# Patient Record
Sex: Female | Born: 1950 | Race: White | Hispanic: No | Marital: Married | Smoking: Current every day smoker
Health system: Southern US, Community
[De-identification: ages and names within clinical notes are randomized; demographics above are authoritative.]

## PROBLEM LIST (undated history)

## (undated) DIAGNOSIS — G4730 Sleep apnea, unspecified: Secondary | ICD-10-CM

## (undated) DIAGNOSIS — J189 Pneumonia, unspecified organism: Secondary | ICD-10-CM

## (undated) DIAGNOSIS — I10 Essential (primary) hypertension: Secondary | ICD-10-CM

## (undated) DIAGNOSIS — I509 Heart failure, unspecified: Secondary | ICD-10-CM

## (undated) DIAGNOSIS — J449 Chronic obstructive pulmonary disease, unspecified: Secondary | ICD-10-CM

## (undated) DIAGNOSIS — E7800 Pure hypercholesterolemia, unspecified: Secondary | ICD-10-CM

## (undated) HISTORY — PX: APPENDECTOMY: SHX54

## (undated) HISTORY — PX: BACK SURGERY: SHX140

## (undated) HISTORY — PX: TUBAL LIGATION: SHX77

## (undated) HISTORY — DX: Pure hypercholesterolemia, unspecified: E78.00

---

## 2000-05-01 ENCOUNTER — Encounter: Payer: Self-pay | Admitting: Emergency Medicine

## 2000-05-01 ENCOUNTER — Emergency Department (HOSPITAL_COMMUNITY): Admission: EM | Admit: 2000-05-01 | Discharge: 2000-05-01 | Payer: Self-pay | Admitting: Emergency Medicine

## 2002-12-12 ENCOUNTER — Encounter: Admission: RE | Admit: 2002-12-12 | Discharge: 2002-12-12 | Payer: Self-pay | Admitting: Occupational Medicine

## 2003-04-08 ENCOUNTER — Ambulatory Visit (HOSPITAL_COMMUNITY): Admission: RE | Admit: 2003-04-08 | Discharge: 2003-04-09 | Payer: Self-pay | Admitting: Neurosurgery

## 2006-04-27 ENCOUNTER — Inpatient Hospital Stay (HOSPITAL_COMMUNITY): Admission: EM | Admit: 2006-04-27 | Discharge: 2006-04-29 | Payer: Self-pay | Admitting: Emergency Medicine

## 2006-08-30 ENCOUNTER — Encounter: Admission: RE | Admit: 2006-08-30 | Discharge: 2006-08-30 | Payer: Self-pay | Admitting: Family Medicine

## 2006-10-31 ENCOUNTER — Encounter: Admission: RE | Admit: 2006-10-31 | Discharge: 2006-10-31 | Payer: Self-pay | Admitting: Family Medicine

## 2008-09-12 ENCOUNTER — Encounter: Admission: RE | Admit: 2008-09-12 | Discharge: 2008-09-12 | Payer: Self-pay | Admitting: Occupational Medicine

## 2010-07-02 NOTE — Discharge Summary (Signed)
NAMELESIA, MONICA           ACCOUNT NO.:  [id number]   MEDICAL RECORD NO.:  [id number]          PATIENT TYPE:  INP   LOCATION:  1325                         FACILITY:  [facility]   PHYSICIAN:  Madaline Savage, MD        DATE OF BIRTH:  1950/07/09   DATE OF ADMISSION:  04/26/2006  DATE OF DISCHARGE:  04/29/2006                               DISCHARGE SUMMARY   PRIMARY CARE PHYSICIAN:  She does not follow up with a primary care  doctor.  The patient is unassigned to [place].   DISCHARGE DIAGNOSIS:  1. Acute bronchitis.  2. Hypoxemia secondary to acute bronchitis.  3. Tobacco abuse.  4. Hypokalemia.  5. Obesity.  6. Questionable obstructive sleep apnea.   DISCHARGE MEDICATIONS:  1. Avelox 400 mg once daily for five more days.  2. Combivent 1 puff 76 hours as needed.  3. Mucinex 600 mg twice daily for 6 more days.   HISTORY OF PRESENT ILLNESS:  For full history and physical, see the  history and physical dictated by Dr. Waymon Amato on April 26, 2006.  In  short, Ms. Jawad is a 60 year old Caucasian lady with no  significant past medical history who comes in with a productive cough,  dyspnea and oxygen desaturation.  She desaturated to 78-79% on room air  in the ER, and she was admitted for further evaluation and management.   PROBLEM LIST:  1. Acute bronchitis.  She did have an x-ray chest on admission which      was negative.  She was started on IV Avelox and put on aerosols,      and her breathing has improved.  Her cough has also improved.  Will      plan to treat her with Avelox for a total of 7 days.  Will continue      her on Combivent at home and also put her on Mucinex.  2. Hypoxemia.  She did have hypoxemia on admission, which is secondary      to her bronchitis.  We did a D-dimer to rule out a PE which came      back negative.  3. Questionable obstructive sleep apnea.  She did desaturate to 81% on      room air while in the hospital.  This did come up, and her oxygen    was put on 2 liters.  She gives me history significant for      obstructive sleep apnea, including daytime somnolence and non-      restful sleep.  She will likely need an outpatient sleep study at      the time of discharge.   DISPOSITION:  She will now be discharged home in stable condition.  During the course of hospital stay, on initial questioning she said she  did want to be a do not resuscitate status, but on further questioning  after admission she said she does want to be a full code.  So, please  note that the patient is a full code at this time.   FOLLOW UP:  She does not have a primary care doctor, and  she is advised  to follow-up with Dr. Amada Kingfisher Bonsu with [facility] in  about 1-2 weeks.  She has been given Dr. Vonda Antigua phone number [phone].  She will call and make an appointment to follow-up with them.      Madaline Savage, MD  Electronically Signed     PKN/MEDQ  D:  04/29/2006  T:  04/30/2006  Job:  [id number]   cc:   Dr. Amada Kingfisher Bonsu

## 2010-07-02 NOTE — Op Note (Signed)
NAME:  Kaylee Pittman, Kaylee Pittman                     ACCOUNT NO.:  [id number]   MEDICAL RECORD NO.:  [id number]                   PATIENT TYPE:  OIB   LOCATION:  3021                                 FACILITY:  [facility]   PHYSICIAN:  Reinaldo Meeker, M.D.              DATE OF BIRTH:  December 01, 1950   DATE OF PROCEDURE:  DATE OF DISCHARGE:  04/08/2003                                 OPERATIVE REPORT   PREOPERATIVE DIAGNOSIS:  Herniated disk at L4-5 left, bilateral.   POSTOPERATIVE DIAGNOSIS:  Herniated disk at L4-5 left, bilateral.   PROCEDURE:  Left L4-5 extraforaminal microdiskectomy.   SURGEON:  Reinaldo Meeker, M.D.   ASSISTANT:  Kaylee Pittman, M.D.   DESCRIPTION OF PROCEDURE:  After being placed in the prone position, the  patient's back was prepped and draped in the usual sterile fashion.  A  localizing x-ray was taken prior to the incision and we identified the  appropriate level.  A midline incision was made above the spinous process of  L4 and L5.  Using Bovie cutting cautery, the incision was carried out above  the spinous processes.  Subperiosteal dissection was then carried out.  Subperiosteal dissection was then carried out on the left side, the longus  spinous processes and lamina and facet joints, all the way down to the  extraforaminal region.  The transverse process of L4 was identified and  cleared of any soft tissue.  Self-retaining retractor was placed for  exposure and x-rays showed that we were at the appropriate level.  A high  speed drill was used to remove in the inferior edge of the transverse  process, the lateral edge of the facet joint, particularly where the two  came together.  This area was undermined.  The anterior transverse ligament  was then incised and the cut edges removed.  Fat and soft tissue in the area  were then identified and removed and the L4 nerve root and L4-5 disk quickly  came into view with the L4-5 disk compressing significantly upon the  L4-5  nerve root.  The microscope was draped and brought into the field and used  to the remainder of the case.  After coagulating on the covering of the L4-5  disk, that covering was incised, and the large herniated fragment was  removed in a piecemeal fashion with pituitary rongeurs.  As more of the disk  material was removed, the nerve root was no longer compressed.  When  thorough clean out had been carried out of the herniated fragment,  inspection was carried out in all directions without any evidence of  residual compression and none could be identified.  Large amounts of  irrigation were carried out and any bleeding controlled with bipolar  coagulation and Gelfoam.  The wound was then closed using interrupted Vicryl  in the muscle, fascia and subcutaneous and subcuticular tissues and staples  on the skin.  A sterile dressing was  then applied.  The patient was  extubated and taken to the recovery room in stable condition.                                               Reinaldo Meeker, M.D.    ROK/MEDQ  D:  04/08/2003  T:  04/08/2003  Job:  [id number]
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
## 2011-05-02 ENCOUNTER — Emergency Department (HOSPITAL_COMMUNITY): Payer: Medicaid Other

## 2011-05-02 ENCOUNTER — Encounter (HOSPITAL_COMMUNITY): Payer: Self-pay | Admitting: *Deleted

## 2011-05-02 ENCOUNTER — Emergency Department (HOSPITAL_COMMUNITY)
Admission: EM | Admit: 2011-05-02 | Discharge: 2011-05-02 | Disposition: A | Discharge status: Home / Self Care | Payer: Medicaid Other | Attending: Emergency Medicine | Admitting: Emergency Medicine

## 2011-05-02 DIAGNOSIS — R062 Wheezing: Secondary | ICD-10-CM

## 2011-05-02 DIAGNOSIS — R059 Cough, unspecified: Secondary | ICD-10-CM | POA: Insufficient documentation

## 2011-05-02 DIAGNOSIS — F17200 Nicotine dependence, unspecified, uncomplicated: Secondary | ICD-10-CM | POA: Insufficient documentation

## 2011-05-02 DIAGNOSIS — G4730 Sleep apnea, unspecified: Secondary | ICD-10-CM | POA: Insufficient documentation

## 2011-05-02 DIAGNOSIS — R51 Headache: Secondary | ICD-10-CM | POA: Insufficient documentation

## 2011-05-02 DIAGNOSIS — J4489 Other specified chronic obstructive pulmonary disease: Secondary | ICD-10-CM | POA: Insufficient documentation

## 2011-05-02 DIAGNOSIS — J449 Chronic obstructive pulmonary disease, unspecified: Secondary | ICD-10-CM | POA: Insufficient documentation

## 2011-05-02 DIAGNOSIS — R05 Cough: Secondary | ICD-10-CM | POA: Insufficient documentation

## 2011-05-02 HISTORY — DX: Chronic obstructive pulmonary disease, unspecified: J44.9

## 2011-05-02 HISTORY — DX: Sleep apnea, unspecified: G47.30

## 2011-05-02 MED ORDER — PREDNISONE 20 MG PO TABS
60.0000 mg | ORAL_TABLET | Freq: Once | ORAL | Status: AC
  Administered 2011-05-02: 60 mg via ORAL
  Filled 2011-05-02: qty 3

## 2011-05-02 MED ORDER — ALBUTEROL SULFATE HFA 108 (90 BASE) MCG/ACT IN AERS
6.0000 | INHALATION_SPRAY | RESPIRATORY_TRACT | Status: AC
  Administered 2011-05-02 (×2): 6 via RESPIRATORY_TRACT
  Filled 2011-05-02: qty 6.7

## 2011-05-02 MED ORDER — PREDNISONE 50 MG PO TABS: 50.0000 mg | ORAL_TABLET | Freq: Every day | ORAL | Status: AC

## 2011-05-02 MED ORDER — KETOROLAC TROMETHAMINE 60 MG/2ML IM SOLN
60.0000 mg | Freq: Once | INTRAMUSCULAR | Status: AC
  Administered 2011-05-02: 60 mg via INTRAMUSCULAR
  Filled 2011-05-02: qty 2

## 2011-05-02 MED ORDER — ALBUTEROL SULFATE HFA 108 (90 BASE) MCG/ACT IN AERS: 1.0000 | INHALATION_SPRAY | Freq: Four times a day (QID) | RESPIRATORY_TRACT | Status: DC | PRN

## 2011-05-02 NOTE — Discharge Instructions (Signed)
Chronic Obstructive Pulmonary Disease Chronic obstructive pulmonary disease (COPD) is a lung disease. The lungs become damaged, making it hard to get air in and out of your lungs. The damage to your lungs cannot be changed.  HOME CARE  Stop smoking if you smoke. Avoid secondhand smoke.   Only take medicine as told by your doctor.   Talk to your doctor about using cough syrup or over-the-counter medicines.   Drink enough fluids to keep your pee (urine) clear or pale yellow.   Use a humidifier or vaporizer. This may help loosen the thick spit (mucus).   Talk to your doctor about vaccines that help prevent other lung problems (pneumonia and flu vaccines).   Use home oxygen as told by your doctor.   Stay active and exercise.   Eat healthy foods.  GET HELP RIGHT AWAY IF:   Your heart is beating fast.   You become disturbed, confused, shake, or are dazed.   You have trouble breathing.   You have chest pain.   You have a fever.   You cough up thick spit that is yellowish-white or green.   Your breathing becomes worse when you exercise.   You are running out of the medicine you take for your breathing.  MAKE SURE YOU:   Understand these instructions.   Will watch your condition.   Will get help right away if you are not doing well or get worse.  Document Released: 07/20/2007 Document Revised: 01/20/2011 Document Reviewed: 04/02/2010 [facility] Patient Information 2012 [place], [place].  Return for any new or worsening symptoms or any other concerns.

## 2011-05-02 NOTE — ED Provider Notes (Signed)
History     CSN: [id number]  Arrival date & time 05/02/11  4098   First MD Initiated Contact with Patient 05/02/11 1957      Chief Complaint  Patient presents with  . Cough    (Consider location/radiation/quality/duration/timing/severity/associated sxs/prior treatment) HPI CC cough and sob chronic for 5 years, has progressively worsened over that time, no acute sob or increase in cough.  Sx's mild to moderate, worse with exertion.  Pt also c/o HA, mild, onset 3 weeks ago, similar to past HA's.   Past Medical History  Diagnosis Date  . Sleep apnea   . COPD (chronic obstructive pulmonary disease)     History reviewed. No pertinent past surgical history.  History reviewed. No pertinent family history.  History  Substance Use Topics  . Smoking status: Current Everyday Smoker -- 2.0 packs/day    Types: Cigarettes  . Smokeless tobacco: Not on file  . Alcohol Use: No    OB History    Grav Para Term Preterm Abortions TAB SAB Ect Mult Living                  Review of Systems  Constitutional: Negative for fever and chills.  Respiratory: Positive for cough, shortness of breath and wheezing.   Cardiovascular: Negative for chest pain.  Gastrointestinal: Negative for nausea and vomiting.  Neurological: Positive for headaches. Negative for speech difficulty, weakness and light-headedness.  Psychiatric/Behavioral: Negative for behavioral problems and confusion.  All other systems reviewed and are negative.    Allergies  Sulfa antibiotics  Home Medications   Current Outpatient Rx  Name Route Sig Dispense Refill  . ASPIRIN 325 MG PO TABS Oral Take 325 mg by mouth daily.    . BC HEADACHE POWDER PO Oral Take 1 packet by mouth every 8 (eight) hours as needed. For headache    . IBUPROFEN 200 MG PO TABS Oral Take 800 mg by mouth every 6 (six) hours as needed. For pain    . ALBUTEROL SULFATE HFA 108 (90 BASE) MCG/ACT IN AERS Inhalation Inhale 1-2 puffs into the lungs every 6  (six) hours as needed for wheezing. 1 Inhaler 0  . PREDNISONE 50 MG PO TABS Oral Take 1 tablet (50 mg total) by mouth daily. 4 tablet 0    BP 156/65  Pulse 77  Temp(Src) 98.9 F (37.2 C) (Oral)  Resp 16  SpO2 94%  Physical Exam  Nursing note and vitals reviewed. Constitutional: She appears well-developed and well-nourished.  HENT:  Head: Normocephalic and atraumatic.  Eyes: Right eye exhibits no discharge. Left eye exhibits no discharge.  Neck: Normal range of motion. Neck supple.  Cardiovascular: Normal rate, regular rhythm and normal heart sounds.   Pulmonary/Chest: Effort normal. No respiratory distress (speaks in full sentences, no accessory muscle use). She has wheezes (mild).  Abdominal: Soft. There is no tenderness.  Musculoskeletal: She exhibits no edema and no tenderness.  Neurological: She is alert. She has normal strength. No cranial nerve deficit or sensory deficit. She displays a negative Romberg sign. Coordination and gait normal. GCS eye subscore is 4. GCS verbal subscore is 5. GCS motor subscore is 6.  Skin: Skin is warm and dry.  Psychiatric: She has a normal mood and affect. Her behavior is normal.    ED Course  Procedures (including critical care time)  Labs Reviewed - No data to display Dg Chest 2 View  05/02/2011  *RADIOLOGY REPORT*  Clinical Data: Cough with shortness of breath.  History of CHF  and hypertension.  History of smoking.  CHEST - 2 VIEW  Comparison: 10/31/2006.  Findings: Cardiomegaly.  No infiltrates or failure.  No effusion or pneumothorax.  Bones unremarkable.  Mild scarring left base. Slight worsening aeration compared with priors.  IMPRESSION: Mild cardiomegaly without active infiltrates.  Slight scarring left base.  Original Report Authenticated By: Elsie Stain, M.D.     1. COPD (chronic obstructive pulmonary disease)   2. Wheezing       MDM  Pt is in nad, afvss, nontoxic appearing, exam and hx as above. Pt satting well, speaks in  full sentences, no accessory muscle use, mild wheezes on exam, cxr shows nothing acute, pt given albuterol mdi and   Prednisone, will recheck  Pt states she feels much better, HA gone, will d/c, return warnings given        Elijio Miles, MD 05/02/11 2324

## 2011-05-02 NOTE — ED Notes (Signed)
To ed for eval of coughing and sob for past 2 weeks. Pt smokes 1-2 packs a day

## 2011-05-03 NOTE — ED Provider Notes (Signed)
I saw and evaluated the patient, reviewed the resident's note and I agree with the findings and plan.  Dg Chest 2 View  05/02/2011  *RADIOLOGY REPORT*  Clinical Data: Cough with shortness of breath.  History of CHF and hypertension.  History of smoking.  CHEST - 2 VIEW  Comparison: 10/31/2006.  Findings: Cardiomegaly.  No infiltrates or failure.  No effusion or pneumothorax.  Bones unremarkable.  Mild scarring left base. Slight worsening aeration compared with priors.  IMPRESSION: Mild cardiomegaly without active infiltrates.  Slight scarring left base.  Original Report Authenticated By: Elsie Stain, M.D.   1. COPD (chronic obstructive pulmonary disease)   2. Wheezing      Lyanne Co, MD 05/03/11 [phone]

## 2011-05-27 ENCOUNTER — Other Ambulatory Visit: Payer: Self-pay | Admitting: Internal Medicine

## 2011-05-27 DIAGNOSIS — Z1231 Encounter for screening mammogram for malignant neoplasm of breast: Secondary | ICD-10-CM

## 2011-06-07 ENCOUNTER — Ambulatory Visit
Admission: RE | Admit: 2011-06-07 | Discharge: 2011-06-07 | Disposition: A | Discharge status: Home / Self Care | Payer: Medicaid Other | Source: Ambulatory Visit | Attending: Internal Medicine | Admitting: Internal Medicine

## 2011-06-07 DIAGNOSIS — Z1231 Encounter for screening mammogram for malignant neoplasm of breast: Secondary | ICD-10-CM

## 2011-06-09 ENCOUNTER — Other Ambulatory Visit: Payer: Self-pay | Admitting: Internal Medicine

## 2011-06-09 DIAGNOSIS — R928 Other abnormal and inconclusive findings on diagnostic imaging of breast: Secondary | ICD-10-CM

## 2011-06-13 ENCOUNTER — Ambulatory Visit
Admission: RE | Admit: 2011-06-13 | Discharge: 2011-06-13 | Disposition: A | Discharge status: Home / Self Care | Payer: Medicaid Other | Source: Ambulatory Visit | Attending: Internal Medicine | Admitting: Internal Medicine

## 2011-06-13 DIAGNOSIS — R928 Other abnormal and inconclusive findings on diagnostic imaging of breast: Secondary | ICD-10-CM

## 2011-06-21 ENCOUNTER — Ambulatory Visit (HOSPITAL_BASED_OUTPATIENT_CLINIC_OR_DEPARTMENT_OTHER): Payer: Medicaid Other | Attending: Internal Medicine

## 2011-06-21 VITALS — Ht <= 58 in | Wt 207.0 lb

## 2011-06-21 DIAGNOSIS — G4733 Obstructive sleep apnea (adult) (pediatric): Secondary | ICD-10-CM | POA: Insufficient documentation

## 2011-06-23 ENCOUNTER — Encounter (HOSPITAL_COMMUNITY): Payer: Self-pay | Admitting: Emergency Medicine

## 2011-06-23 ENCOUNTER — Emergency Department (HOSPITAL_COMMUNITY): Payer: Medicaid Other

## 2011-06-23 ENCOUNTER — Emergency Department (HOSPITAL_COMMUNITY)
Admission: EM | Admit: 2011-06-23 | Discharge: 2011-06-23 | Disposition: A | Discharge status: Home / Self Care | Payer: Medicaid Other | Attending: Emergency Medicine | Admitting: Emergency Medicine

## 2011-06-23 DIAGNOSIS — R609 Edema, unspecified: Secondary | ICD-10-CM | POA: Insufficient documentation

## 2011-06-23 DIAGNOSIS — Z79899 Other long term (current) drug therapy: Secondary | ICD-10-CM | POA: Insufficient documentation

## 2011-06-23 DIAGNOSIS — I509 Heart failure, unspecified: Secondary | ICD-10-CM | POA: Insufficient documentation

## 2011-06-23 DIAGNOSIS — I10 Essential (primary) hypertension: Secondary | ICD-10-CM | POA: Insufficient documentation

## 2011-06-23 DIAGNOSIS — M7989 Other specified soft tissue disorders: Secondary | ICD-10-CM | POA: Insufficient documentation

## 2011-06-23 DIAGNOSIS — R0989 Other specified symptoms and signs involving the circulatory and respiratory systems: Secondary | ICD-10-CM | POA: Insufficient documentation

## 2011-06-23 DIAGNOSIS — R0609 Other forms of dyspnea: Secondary | ICD-10-CM | POA: Insufficient documentation

## 2011-06-23 HISTORY — DX: Essential (primary) hypertension: I10

## 2011-06-23 HISTORY — DX: Pneumonia, unspecified organism: J18.9

## 2011-06-23 HISTORY — DX: Heart failure, unspecified: I50.9

## 2011-06-23 LAB — BASIC METABOLIC PANEL
CO2: 29 mEq/L (ref 19–32)
Calcium: 9.3 mg/dL (ref 8.4–10.5)
Creatinine, Ser: 0.71 mg/dL (ref 0.50–1.10)
Glucose, Bld: 89 mg/dL (ref 70–99)

## 2011-06-23 LAB — DIFFERENTIAL
Eosinophils Relative: 2 % (ref 0–5)
Lymphocytes Relative: 22 % (ref 12–46)
Lymphs Abs: 2.1 10*3/uL (ref 0.7–4.0)
Monocytes Absolute: 0.7 10*3/uL (ref 0.1–1.0)

## 2011-06-23 LAB — CBC
HCT: 43.5 % (ref 36.0–46.0)
MCV: 95.6 fL (ref 78.0–100.0)
RBC: 4.55 MIL/uL (ref 3.87–5.11)
RDW: 13 % (ref 11.5–15.5)
WBC: 9.5 10*3/uL (ref 4.0–10.5)

## 2011-06-23 MED ORDER — ENOXAPARIN SODIUM 100 MG/ML ~~LOC~~ SOLN
1.5000 mg/kg | Freq: Once | SUBCUTANEOUS | Status: AC
  Administered 2011-06-23: 140 mg via SUBCUTANEOUS
  Filled 2011-06-23: qty 2

## 2011-06-23 MED ORDER — ETHACRYNIC ACID 25 MG PO TABS: 25.0000 mg | ORAL_TABLET | Freq: Two times a day (BID) | ORAL | Status: DC

## 2011-06-23 NOTE — ED Provider Notes (Signed)
61 year old female had been on Lasix for control of peripheral edema but had to stop it because of allergy. Since then, she has had progressive swelling of her legs. She denies chest pain, heaviness, tightness, pressure. She did see her PCP today who referred her to the emergency department to rule out DVT. Clinically, she has 2-3+ pitting edema of her legs and no objective findings suggestive of DVT. She will need to be switched to a diuretic that does not cross-react with sulfa. Head d-dimer will be obtained to to try and rule out DVT. If positive, she will need to return in the morning for Doppler studies.  Dione Booze, MD 06/23/11 [phone]

## 2011-06-23 NOTE — ED Notes (Signed)
PT. REPORTS PROGRESSING BILATERAL LOWER LEG SWELLING FOR 3 WEEKS WITH SLIGHT SOB AND OCCASIONAL PRODUCTIVE COUGH.

## 2011-06-23 NOTE — ED Provider Notes (Signed)
History     CSN: [id number]  Arrival date & time 06/23/11  1955   First MD Initiated Contact with Patient 06/23/11 2056      Chief Complaint  Patient presents with  . Leg Swelling    (Consider location/radiation/quality/duration/timing/severity/associated sxs/prior treatment) HPI Comments: Progressively worsening bilateral lower extremity edema over 3 weeks.  Dyspnea c/w baseline (from her COPD) per the pt.  No fevers.  No recent immobility.  Pt says this has happened before with her CHF.  Has taken a diuretic in the past for her edema but is not on one now.  Patient is a 61 y.o. female presenting with leg pain. The history is provided by the patient.  Leg Pain  Incident onset: 3 weeks. The incident occurred at home. There was no injury mechanism. The pain is present in the left leg and right leg. The quality of the pain is described as aching. The pain is mild. Pertinent negatives include no numbness, no loss of motion and no loss of sensation. She reports no foreign bodies present. The symptoms are aggravated by nothing.    Past Medical History  Diagnosis Date  . Sleep apnea   . COPD (chronic obstructive pulmonary disease)   . CHF (congestive heart failure)   . Hypertension   . Pneumonia   . Emphysema     Past Surgical History  Procedure Date  . Back surgery   . Cesarean section     No family history on file.  History  Substance Use Topics  . Smoking status: Current Everyday Smoker -- 2.0 packs/day    Types: Cigarettes  . Smokeless tobacco: Not on file  . Alcohol Use: No    OB History    Grav Para Term Preterm Abortions TAB SAB Ect Mult Living                  Review of Systems  Constitutional: Negative for activity change.  HENT: Negative for congestion.   Eyes: Negative for visual disturbance.  Respiratory: Negative for chest tightness and shortness of breath.   Cardiovascular: Negative for chest pain and leg swelling.  Gastrointestinal: Negative for  abdominal pain.  Genitourinary: Negative for dysuria.  Skin: Negative for rash.  Neurological: Negative for syncope and numbness.  Psychiatric/Behavioral: Negative for behavioral problems.    Allergies  Sulfa antibiotics  Home Medications   Current Outpatient Rx  Name Route Sig Dispense Refill  . ACETAMINOPHEN 500 MG PO TABS Oral Take 500 mg by mouth every 6 (six) hours as needed.    . ALBUTEROL SULFATE HFA 108 (90 BASE) MCG/ACT IN AERS Inhalation Inhale 1-2 puffs into the lungs every 6 (six) hours as needed.    Marland Kitchen AMLODIPINE BESYLATE 5 MG PO TABS Oral Take 5 mg by mouth daily.    . ASPIRIN 325 MG PO TABS Oral Take 325 mg by mouth daily.    . BC HEADACHE POWDER PO Oral Take 1 packet by mouth every 8 (eight) hours as needed. For headache    . DOXYCYCLINE HYCLATE 100 MG PO CAPS Oral Take 100 mg by mouth 2 (two) times daily. For infection    . FUROSEMIDE 20 MG PO TABS Oral Take 20 mg by mouth 2 (two) times daily.    . IBUPROFEN 200 MG PO TABS Oral Take 800 mg by mouth every 6 (six) hours as needed. For pain    . MOMETASONE FURO-FORMOTEROL FUM 100-5 MCG/ACT IN AERO Inhalation Inhale 2 puffs into the lungs daily.    Marland Kitchen  OMEPRAZOLE 20 MG PO CPDR Oral Take 20 mg by mouth 2 (two) times daily.    Marland Kitchen ETHACRYNIC ACID 25 MG PO TABS Oral Take 1 tablet (25 mg total) by mouth 2 (two) times daily. 30 tablet 0    BP 126/66  Pulse 73  Temp(Src) 97.9 F (36.6 C) (Oral)  Resp 18  Wt 207 lb (93.895 kg)  SpO2 92%  LMP 08/15/2000  Physical Exam  Constitutional: She is oriented to person, place, and time. She appears well-developed and well-nourished.  HENT:  Head: Normocephalic and atraumatic.  Eyes: Conjunctivae and EOM are normal. Pupils are equal, round, and reactive to light. No scleral icterus.  Neck: Normal range of motion. Neck supple.  Cardiovascular: Normal rate and regular rhythm.  Exam reveals no gallop and no friction rub.   No murmur heard. Pulmonary/Chest: Effort normal and breath  sounds normal. No respiratory distress. She has no wheezes. She has no rales. She exhibits no tenderness.  Abdominal: Soft. She exhibits no distension and no mass. There is no tenderness. There is no rebound and no guarding.  Musculoskeletal: Normal range of motion.       2+ bilateral LE edema.  Neurological: She is alert and oriented to person, place, and time. She has normal reflexes. No cranial nerve deficit.  Skin: Skin is warm and dry. No rash noted.  Psychiatric: She has a normal mood and affect. Her behavior is normal. Judgment and thought content normal.    ED Course  Procedures (including critical care time)  Labs Reviewed  D-DIMER, QUANTITATIVE - Abnormal; Notable for the following:    D-Dimer, Quant 0.67 (*)    All other components within normal limits  CBC  DIFFERENTIAL  BASIC METABOLIC PANEL  PRO B NATRIURETIC PEPTIDE   Dg Chest 2 View  06/23/2011  *RADIOLOGY REPORT*  Clinical Data: Bilateral leg swelling.  Congestive heart failure.  CHEST - 2 VIEW  Comparison: Two-view chest 05/02/2011.  Findings: Mild cardiac enlargement is noted.  The there is no edema or effusion to suggest failure.  Degenerative changes of the thoracolumbar spine are stable.  IMPRESSION:  1.  Borderline cardiomegaly without failure. 2.  No acute cardiopulmonary disease.  Original Report Authenticated By: Jamesetta Orleans. MATTERN, M.D.     1. Edema   2. CHF (congestive heart failure)       MDM   Progressively worsening bilateral lower extremity edema over 3 weeks.  Dyspnea c/w baseline (from her COPD) per the pt.  No fevers.  No recent immobility.  Pt says this has happened before with her CHF.  Has taken a diuretic in the past for her edema but is not on one now.  Taken off lasix 2/2 sulfa allergy.  Sent to ED for r/o DVT.  Ddimer positive.  No evidence of PE on exam.  Pt with volume overload but no O2 requirement and is comfortable appearing.  Will treat with ethacrynic acid.  Gave lovenox dose and pt  to return during daytime hours for a DVT study.  Pt comfortable with plan and will return in the am.  No clinical evidence of ACS, PE.          Army Chaco, MD 06/23/11 2246

## 2011-06-23 NOTE — Discharge Instructions (Signed)
Edema Edema is an abnormal build-up of fluids in tissues. Because this is partly dependent on gravity (water flows to the lowest place), it is more common in the leg sand thighs (lower extremities). It is also common in the looser tissues, like around the eyes. Painless swelling of the feet and ankles is common and increases as a person ages. It may affect both legs and may include the calves or even thighs. When squeezed, the fluid may move out of the affected area and may leave a dent for a few moments. CAUSES   Prolonged standing or sitting in one place for extended periods of time. Movement helps pump tissue fluid into the veins, and absence of movement prevents this, resulting in edema.   Varicose veins. The valves in the veins do not work as well as they should. This causes fluid to leak into the tissues.   Fluid and salt overload.   Injury, burn, or surgery to the leg, ankle, or foot, may damage veins and allow fluid to leak out.   Sunburn damages vessels. Leaky vessels allow fluid to go out into the sunburned tissues.   Allergies (from insect bites or stings, medications or chemicals) cause swelling by allowing vessels to become leaky.   Protein in the blood helps keep fluid in your vessels. Low protein, as in malnutrition, allows fluid to leak out.   Hormonal changes, including pregnancy and menstruation, cause fluid retention. This fluid may leak out of vessels and cause edema.   Medications that cause fluid retention. Examples are sex hormones, blood pressure medications, steroid treatment, or anti-depressants.   Some illnesses cause edema, especially heart failure, kidney disease, or liver disease.   Surgery that cuts veins or lymph nodes, such as surgery done for the heart or for breast cancer, may result in edema.  DIAGNOSIS  Your caregiver is usually easily able to determine what is causing your swelling (edema) by simply asking what is wrong (getting a history) and examining  you (doing a physical). Sometimes x-rays, EKG (electrocardiogram or heart tracing), and blood work may be done to evaluate for underlying medical illness. TREATMENT  General treatment includes:  Leg elevation (or elevation of the affected body part).   Restriction of fluid intake.   Prevention of fluid overload.   Compression of the affected body part. Compression with elastic bandages or support stockings squeezes the tissues, preventing fluid from entering and forcing it back into the blood vessels.   Diuretics (also called water pills or fluid pills) pull fluid out of your body in the form of increased urination. These are effective in reducing the swelling, but can have side effects and must be used only under your caregiver's supervision. Diuretics are appropriate only for some types of edema.  The specific treatment can be directed at any underlying causes discovered. Heart, liver, or kidney disease should be treated appropriately. HOME CARE INSTRUCTIONS   Elevate the legs (or affected body part) above the level of the heart, while lying down.   Avoid sitting or standing still for prolonged periods of time.   Avoid putting anything directly under the knees when lying down, and do not wear constricting clothing or garters on the upper legs.   Exercising the legs causes the fluid to work back into the veins and lymphatic channels. This may help the swelling go down.   The pressure applied by elastic bandages or support stockings can help reduce ankle swelling.   A low-salt diet may help reduce fluid   retention and decrease the ankle swelling.   Take any medications exactly as prescribed.  SEEK MEDICAL CARE IF:  Your edema is not responding to recommended treatments. SEEK IMMEDIATE MEDICAL CARE IF:   You develop shortness of breath or chest pain.   You cannot breathe when you lay down; or if, while lying down, you have to get up and go to the window to get your breath.   You  are having increasing swelling without relief from treatment.   You develop a fever over 102 F (38.9 C).   You develop pain or redness in the areas that are swollen.   Tell your caregiver right away if you have gained 3 lb/1.4 kg in 1 day or 5 lb/2.3 kg in a week.  MAKE SURE YOU:   Understand these instructions.   Will watch your condition.   Will get help right away if you are not doing well or get worse.  Document Released: 01/31/2005 Document Revised: 01/20/2011 Document Reviewed: 09/19/2007 [employer] Patient Information 2012 [employer]. 

## 2011-06-26 ENCOUNTER — Encounter (HOSPITAL_BASED_OUTPATIENT_CLINIC_OR_DEPARTMENT_OTHER): Payer: Medicaid Other

## 2011-06-26 DIAGNOSIS — G4733 Obstructive sleep apnea (adult) (pediatric): Secondary | ICD-10-CM

## 2011-06-26 NOTE — Procedures (Signed)
NAME:  Kaylee Pittman, Kaylee Pittman           ACCOUNT NO.:  [id number]  MEDICAL RECORD NO.:  [id number]          PATIENT TYPE:  OUT  LOCATION:  SLEEP CENTER                 FACILITY:  [facility]  PHYSICIAN:  Eliani Leclere D. Maple Hudson, MD, FCCP, FACPDATE OF BIRTH:  10[birth year]05[birth year]1952  DATE OF STUDY:  06/21/2011                           NOCTURNAL POLYSOMNOGRAM  REFERRING PHYSICIAN:  Fleet Contras, M.D.  REFERRING PHYSICIAN:  Fleet Contras, M.D.  INDICATION FOR STUDY:  Hypersomnia with sleep apnea.  EPWORTH SLEEPINESS SCORE:  13/24.  BMI 46.4, weight 207 pounds, height 56 inches, neck 16 inches.  HOME MEDICATIONS:  Charted and reviewed.  SLEEP ARCHITECTURE:  Total sleep time 346.5 minutes with sleep efficiency 87.2%.  Stage I 15.6%, stage II 76.8%.  Stage III absent, REM 7.6% of total sleep time.  Sleep latency 3 minutes, REM latency 224 minutes.  Awake after sleep onset 48 minutes.  Arousal index 50.9. Bedtime medication:  None.  RESPIRATORY DATA:  Apnea/hypopnea index (AHI) 5.4 per hour.  A total of 31 events was scored including 3 obstructive apneas and 28 hypopneas. Events were seen in all sleep positions, especially nonsupine and REM. REM AHI 11.3 per hour.  There were insufficient numbers of early events to meet protocol requirements for application of split CPAP titration protocol on this study night.  OXYGEN DATA:  Moderate snoring with oxygen desaturation to a nadir of 76% and mean oxygen saturation of 90.4%.  The technician added supplemental oxygen at 1 L per minute at 22:15 because of persistent oxygen desaturation.  During the total recording time, 96.1 minutes were recorded with oxygen saturation less than 90% and 38.2 minutes were recorded with oxygen saturation less than 88% while on supplemental oxygen.  CARDIAC DATA:  Sinus rhythm with PVCs and PACs.  MOVEMENT/PARASOMNIA:  No significant movement disturbance.  Bathroom x3.  IMPRESSION/RECOMMENDATION: 1. Minimal obstructive sleep  apnea/hypopnea syndrome, AHI 5.4 per     hour.  The normal range for adults is from 0-5 events per hour.     Moderate snoring with oxygen desaturation to a nadir of 76%. 2. Scores in this range are not usually addressed with CPAP therapy.     Weight loss and treatment for any significant nasal congestion may     be helpful. 3. Room air saturation on arrival while awake was 90%.  During the     study, she desaturated to a nadir of 76% with mean oxygen     saturation of 90.4%.  The technician added supplemental oxygen at 1     L per minute per protocol, at 22:15 p.m.  Despite this, during the     total recording time, a total of 96.1 minutes was recorded with     room air saturation less than 90% and 38.2 minutes were recorded     with saturation less than 88%.  Consider evaluation for     supplemental oxygen during sleep in the home environment if     clinically appropriate.     Dani Danis D. Maple Hudson, MD, [facility], FACP Diplomate, American Board of Sleep Medicine    CDY/MEDQ  D:  06/26/2011 11:18:41  T:  06/26/2011 22:19:52  Job:  [id number]

## 2011-06-26 NOTE — ED Provider Notes (Signed)
I saw and evaluated the patient, reviewed the resident's note and I agree with the findings and plan.   Dione Booze, MD 06/26/11 [phone]

## 2011-06-27 ENCOUNTER — Ambulatory Visit (HOSPITAL_COMMUNITY)
Admission: RE | Admit: 2011-06-27 | Discharge: 2011-06-27 | Disposition: A | Discharge status: Home / Self Care | Payer: Medicaid Other | Source: Ambulatory Visit | Attending: Emergency Medicine | Admitting: Emergency Medicine

## 2011-06-27 DIAGNOSIS — M7989 Other specified soft tissue disorders: Secondary | ICD-10-CM

## 2011-06-27 DIAGNOSIS — J4489 Other specified chronic obstructive pulmonary disease: Secondary | ICD-10-CM | POA: Insufficient documentation

## 2011-06-27 DIAGNOSIS — J449 Chronic obstructive pulmonary disease, unspecified: Secondary | ICD-10-CM | POA: Insufficient documentation

## 2011-06-27 DIAGNOSIS — F17200 Nicotine dependence, unspecified, uncomplicated: Secondary | ICD-10-CM | POA: Insufficient documentation

## 2011-06-27 DIAGNOSIS — I509 Heart failure, unspecified: Secondary | ICD-10-CM | POA: Insufficient documentation

## 2011-06-27 DIAGNOSIS — R609 Edema, unspecified: Secondary | ICD-10-CM

## 2011-06-27 DIAGNOSIS — I10 Essential (primary) hypertension: Secondary | ICD-10-CM | POA: Insufficient documentation

## 2011-06-27 NOTE — Progress Notes (Signed)
VASCULAR LAB PRELIMINARY  PRELIMINARY  PRELIMINARY  PRELIMINARY  Bilateral venous Dopplers completed.    Preliminary report:  There is no obvious evidence of DVT or SVT noted in the bilateral lower extremities.  Sherren Kerns [facility], 06/27/2011, 10:12 AM

## 2011-09-21 ENCOUNTER — Other Ambulatory Visit: Payer: Self-pay | Admitting: Internal Medicine

## 2011-09-21 DIAGNOSIS — R1013 Epigastric pain: Secondary | ICD-10-CM

## 2011-09-27 ENCOUNTER — Ambulatory Visit
Admission: RE | Admit: 2011-09-27 | Discharge: 2011-09-27 | Disposition: A | Discharge status: Home / Self Care | Payer: Medicaid Other | Source: Ambulatory Visit | Attending: Internal Medicine | Admitting: Internal Medicine

## 2011-09-27 DIAGNOSIS — R1013 Epigastric pain: Secondary | ICD-10-CM

## 2011-10-24 ENCOUNTER — Encounter: Payer: Self-pay | Admitting: Emergency Medicine

## 2011-10-24 ENCOUNTER — Ambulatory Visit (INDEPENDENT_AMBULATORY_CARE_PROVIDER_SITE_OTHER): Payer: Medicaid Other | Admitting: Emergency Medicine

## 2011-10-24 VITALS — BP 100/64 | HR 73 | Ht <= 58 in | Wt 206.2 lb

## 2011-10-24 DIAGNOSIS — J449 Chronic obstructive pulmonary disease, unspecified: Secondary | ICD-10-CM

## 2011-10-24 DIAGNOSIS — Z720 Tobacco use: Secondary | ICD-10-CM

## 2011-10-24 DIAGNOSIS — I2789 Other specified pulmonary heart diseases: Secondary | ICD-10-CM

## 2011-10-24 DIAGNOSIS — F17200 Nicotine dependence, unspecified, uncomplicated: Secondary | ICD-10-CM

## 2011-10-24 DIAGNOSIS — I10 Essential (primary) hypertension: Secondary | ICD-10-CM

## 2011-10-24 DIAGNOSIS — E669 Obesity, unspecified: Secondary | ICD-10-CM | POA: Insufficient documentation

## 2011-10-24 DIAGNOSIS — I272 Pulmonary hypertension, unspecified: Secondary | ICD-10-CM | POA: Insufficient documentation

## 2011-10-24 NOTE — Patient Instructions (Addendum)
Stop Spiriva and try using Tudorza 1 inhalation twice a day Continue your Dulera 2 puffs twice a day Use ProAir 2 puffs if needed for shortness of breath You need to start using oxygen with walking at 2L/min. Continue to use this with sleeping also.  We need to get you off cigarettes. Start by cutting down to 1 pack per day by our next visit We will perform full pulmonary function testing at your next visit Follow with Dr Delton Coombes in 1 month with full PFT

## 2011-10-24 NOTE — Progress Notes (Signed)
Subjective:    Patient ID: Kaylee Pittman, female    DOB: 1950/10/09, 61 y.o.   MRN: [id number]  HPI 61 yo obese woman, hx tobacco (80 pk-yrs, continues 1.5pk/day) and presumed COPD, documented hypoxemia. She has had PSG 5/13 that did not not show OSA but did show hypoxemia. She used to be on Ventolin + Advair, but it was all stopped. Now she is now on Spiriva + Dulera + SABA prn.  She can't take the Spiriva because it makes her nauseated and have emesis. Rarely uses ProAir. She describes freq bronchitis - abx 3 times a year. Admitted 2008, never ventilated. Has been seen by [facility], reassuring stress test. TTE showed some RV dilation with preserved fxn. ECG with RBBB. She has severe exertional SOB.    Review of Systems  Constitutional: Positive for unexpected weight change. Negative for fever, chills, diaphoresis, activity change, appetite change and fatigue.  HENT: Positive for congestion and trouble swallowing. Negative for hearing loss, ear pain, nosebleeds, sore throat, facial swelling, rhinorrhea, sneezing, mouth sores, neck pain, neck stiffness, dental problem, voice change, postnasal drip, sinus pressure, tinnitus and ear discharge.   Eyes: Negative for photophobia, discharge, itching and visual disturbance.  Respiratory: Positive for cough and shortness of breath. Negative for apnea, choking, chest tightness, wheezing and stridor.   Cardiovascular: Positive for chest pain, palpitations and leg swelling.  Gastrointestinal: Negative for nausea, vomiting, abdominal pain, constipation, blood in stool and abdominal distention.  Genitourinary: Negative for dysuria, urgency, frequency, hematuria, flank pain, decreased urine volume and difficulty urinating.  Musculoskeletal: Negative for myalgias, back pain, joint swelling, arthralgias and gait problem.  Skin: Positive for rash.  Neurological: Positive for headaches. Negative for dizziness, tremors, seizures, syncope, speech difficulty,  weakness, light-headedness and numbness.  Hematological: Does not bruise/bleed easily.  Psychiatric/Behavioral: Negative for confusion, disturbed wake/sleep cycle and agitation. The patient is nervous/anxious.    Past Medical History  Diagnosis Date  . Sleep apnea   . COPD (chronic obstructive pulmonary disease)   . CHF (congestive heart failure)   . Hypertension   . Pneumonia   . Emphysema   . Hypercholesteremia      No family history on file.   History   Social History  . Marital Status: Married    Spouse Name: N/A    Number of Children: 2  . Years of Education: N/A   Occupational History  . diasbility    Social History Main Topics  . Smoking status: Current Everyday Smoker -- 1.5 packs/day for 34 years    Types: Cigarettes  . Smokeless tobacco: Never Used  . Alcohol Use: No  . Drug Use: No  . Sexually Active: Not on file   Other Topics Concern  . Not on file   Social History Narrative  . No narrative on file     Allergies  Allergen Reactions  . Sulfa Antibiotics Hives    unknown     Outpatient Prescriptions Prior to Visit  Medication Sig Dispense Refill  . albuterol (PROVENTIL HFA;VENTOLIN HFA) 108 (90 BASE) MCG/ACT inhaler Inhale 1-2 puffs into the lungs every 6 (six) hours as needed.      Marland Kitchen aspirin 325 MG tablet Take 325 mg by mouth daily.      . Aspirin-Salicylamide-Caffeine (BC HEADACHE POWDER PO) Take 1 packet by mouth every 8 (eight) hours as needed. For headache      . ethacrynic acid (EDECRIN) 25 MG tablet Take 1 tablet (25 mg total) by mouth 2 (two) times  daily.  30 tablet  0  . furosemide (LASIX) 20 MG tablet Take 40 mg by mouth daily.       Marland Kitchen ibuprofen (ADVIL,MOTRIN) 200 MG tablet Take 800 mg by mouth every 6 (six) hours as needed. For pain      . mometasone-formoterol (DULERA) 100-5 MCG/ACT AERO Inhale 2 puffs into the lungs daily.      Marland Kitchen omeprazole (PRILOSEC) 20 MG capsule Take 20 mg by mouth 2 (two) times daily.      Marland Kitchen acetaminophen  (TYLENOL) 500 MG tablet Take 500 mg by mouth every 6 (six) hours as needed.      Marland Kitchen amLODipine (NORVASC) 5 MG tablet Take 5 mg by mouth daily.             Objective:   Physical Exam Filed Vitals:   10/24/11 1655  BP: 100/64  Pulse: 73   Gen: Pleasant, obese woman, in no distress,  normal affect  ENT: No lesions,  mouth clear,  oropharynx clear, no postnasal drip  Neck: No JVD, no TMG, no carotid bruits  Lungs: No use of accessory muscles, distant, no wheezes  Cardiovascular: RRR, heart sounds normal, no murmur or gallops, 1+ peripheral edem  Musculoskeletal: No deformities, no cyanosis or clubbing  Neuro: alert, non focal  Skin: Warm, no lesions or rashes     Assessment & Plan:  COPD (chronic obstructive pulmonary disease) Suspect that this is her primary problem (+ obesity and restriction).  - walking oximetry - trial changing spiriva to tudorza since dhe is having emesis with the spiriva - continue dulera 200 bid - smoking cessation - rov 1 mon with full PFT  Tobacco abuse - discussed cessation, first goal will be to cut down to 1 pk/day by next visit  Pulmonary hypertension Likely due to her COPD, probable OHS, chronic hypoxemia.  - start O2 - treat underlying pulmonary causes.  - consider further eval or evan meds at some point in the future

## 2011-10-24 NOTE — Assessment & Plan Note (Addendum)
Likely due to her COPD, probable OHS, chronic hypoxemia.  - start O2 - treat underlying pulmonary causes.  - consider further eval or evan meds at some point in the future

## 2011-10-24 NOTE — Assessment & Plan Note (Signed)
Suspect that this is her primary problem (+ obesity and restriction).  - walking oximetry - trial changing spiriva to tudorza since dhe is having emesis with the spiriva - continue dulera 200 bid - smoking cessation - rov 1 mon with full PFT

## 2011-10-24 NOTE — Assessment & Plan Note (Signed)
-   discussed cessation, first goal will be to cut down to 1 pk/day by next visit

## 2011-11-02 ENCOUNTER — Telehealth: Payer: Self-pay | Admitting: Emergency Medicine

## 2011-11-02 NOTE — Telephone Encounter (Signed)
Spoke to [email or web address]  waiting on dr byrum to be in the office so he can sign it Tobe Sos

## 2011-12-05 ENCOUNTER — Ambulatory Visit: Payer: Medicaid Other | Admitting: Emergency Medicine

## 2013-07-17 ENCOUNTER — Other Ambulatory Visit: Payer: Self-pay | Admitting: Internal Medicine

## 2013-07-17 DIAGNOSIS — Z1231 Encounter for screening mammogram for malignant neoplasm of breast: Secondary | ICD-10-CM

## 2013-07-24 ENCOUNTER — Ambulatory Visit
Admission: RE | Admit: 2013-07-24 | Discharge: 2013-07-24 | Disposition: A | Discharge status: Home / Self Care | Payer: Commercial Managed Care - HMO | Source: Ambulatory Visit | Attending: Internal Medicine | Admitting: Internal Medicine

## 2013-07-24 DIAGNOSIS — Z1231 Encounter for screening mammogram for malignant neoplasm of breast: Secondary | ICD-10-CM

## 2013-07-26 ENCOUNTER — Other Ambulatory Visit: Payer: Self-pay | Admitting: Internal Medicine

## 2013-07-26 DIAGNOSIS — R928 Other abnormal and inconclusive findings on diagnostic imaging of breast: Secondary | ICD-10-CM

## 2013-08-06 ENCOUNTER — Other Ambulatory Visit: Payer: Self-pay | Admitting: Internal Medicine

## 2013-08-06 ENCOUNTER — Ambulatory Visit
Admission: RE | Admit: 2013-08-06 | Discharge: 2013-08-06 | Disposition: A | Discharge status: Home / Self Care | Payer: Commercial Managed Care - HMO | Source: Ambulatory Visit | Attending: Internal Medicine | Admitting: Internal Medicine

## 2013-08-06 DIAGNOSIS — N6310 Unspecified lump in the right breast, unspecified quadrant: Secondary | ICD-10-CM

## 2013-08-06 DIAGNOSIS — R928 Other abnormal and inconclusive findings on diagnostic imaging of breast: Secondary | ICD-10-CM

## 2013-08-09 IMAGING — CR DG CHEST 2V
2 series · 2 of 2 positions shown · non-contrast
Comparison: Two-view chest 05/02/2011.

CLINICAL DATA: Bilateral leg swelling.  Congestive heart failure.

CHEST - 2 VIEW

[w chest pa]
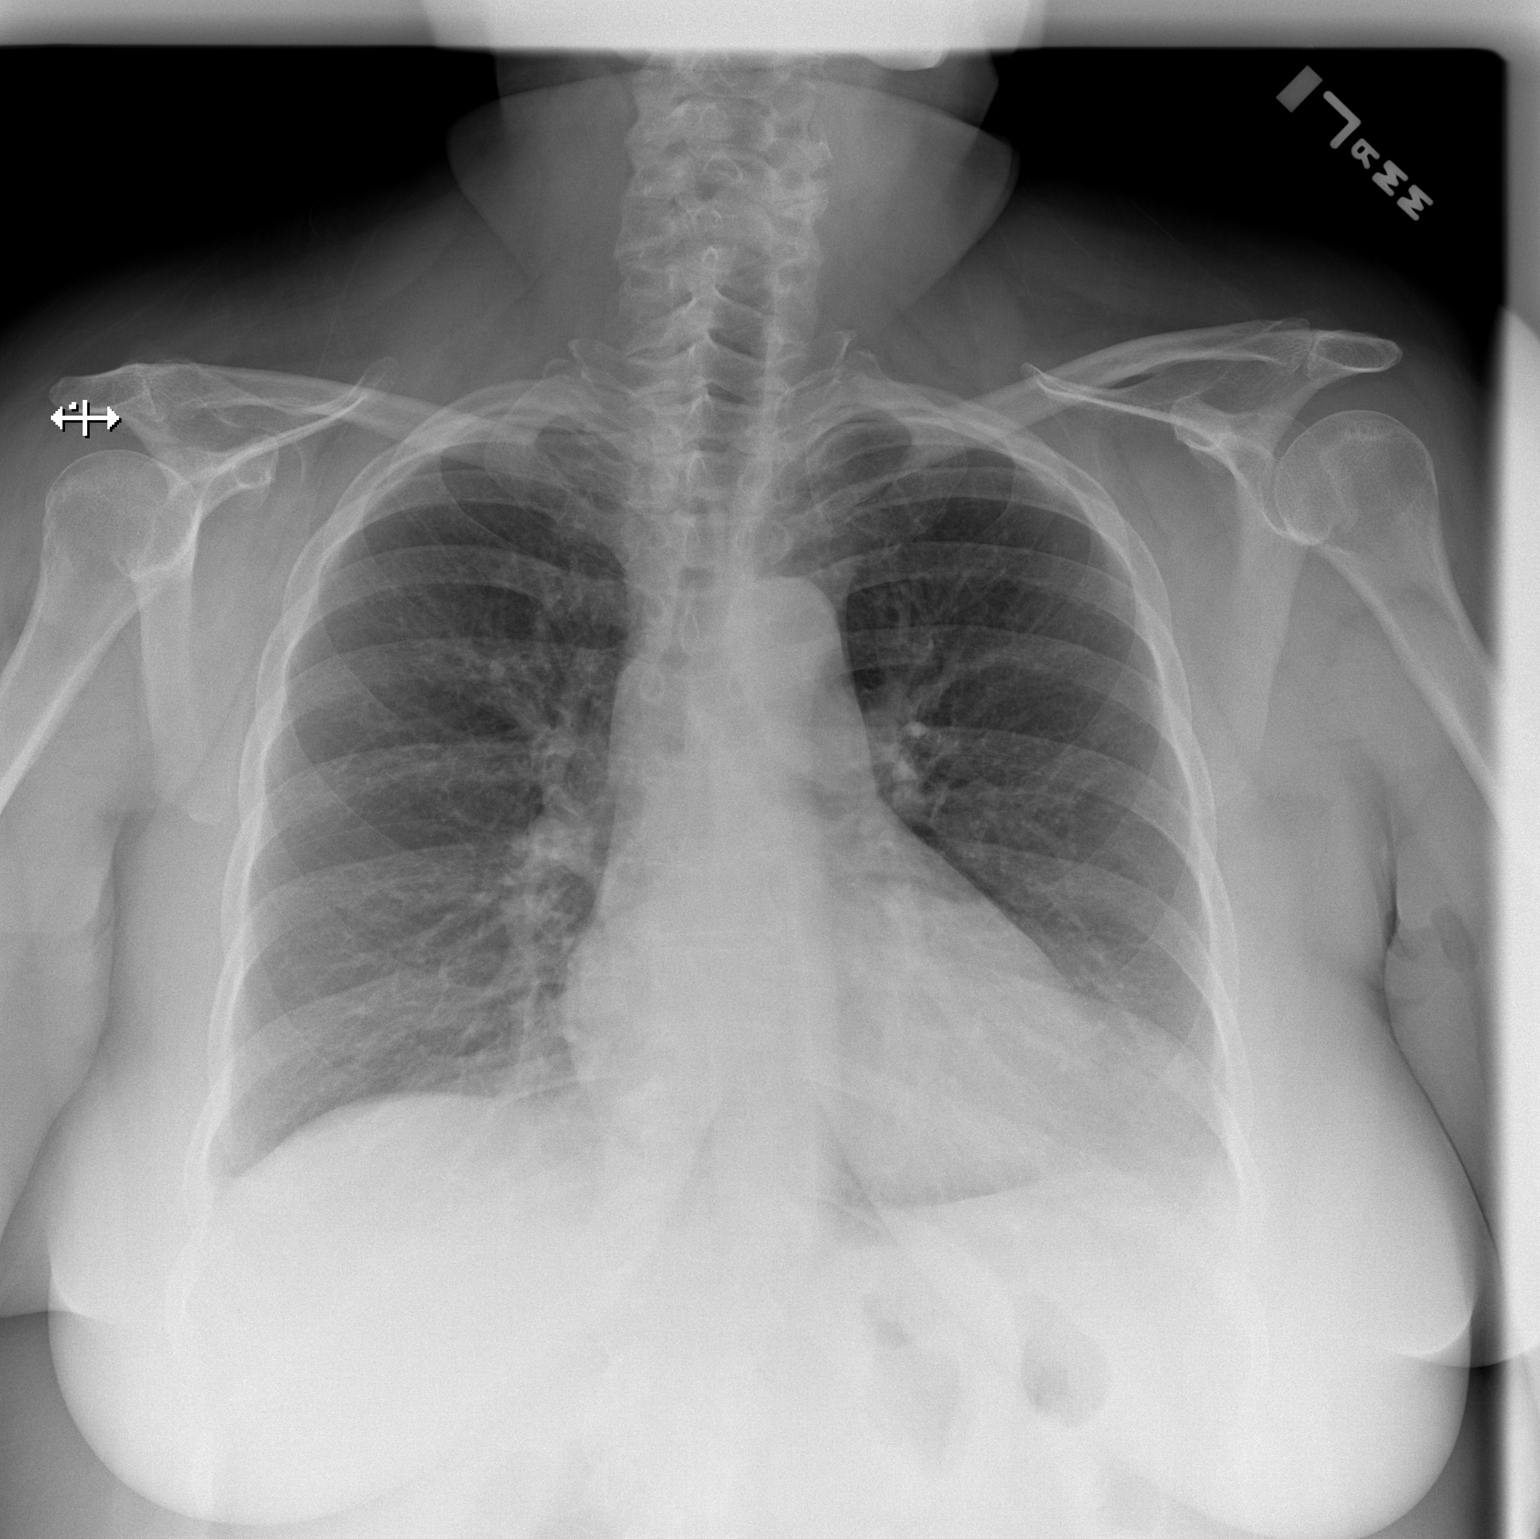

[w chest lat]
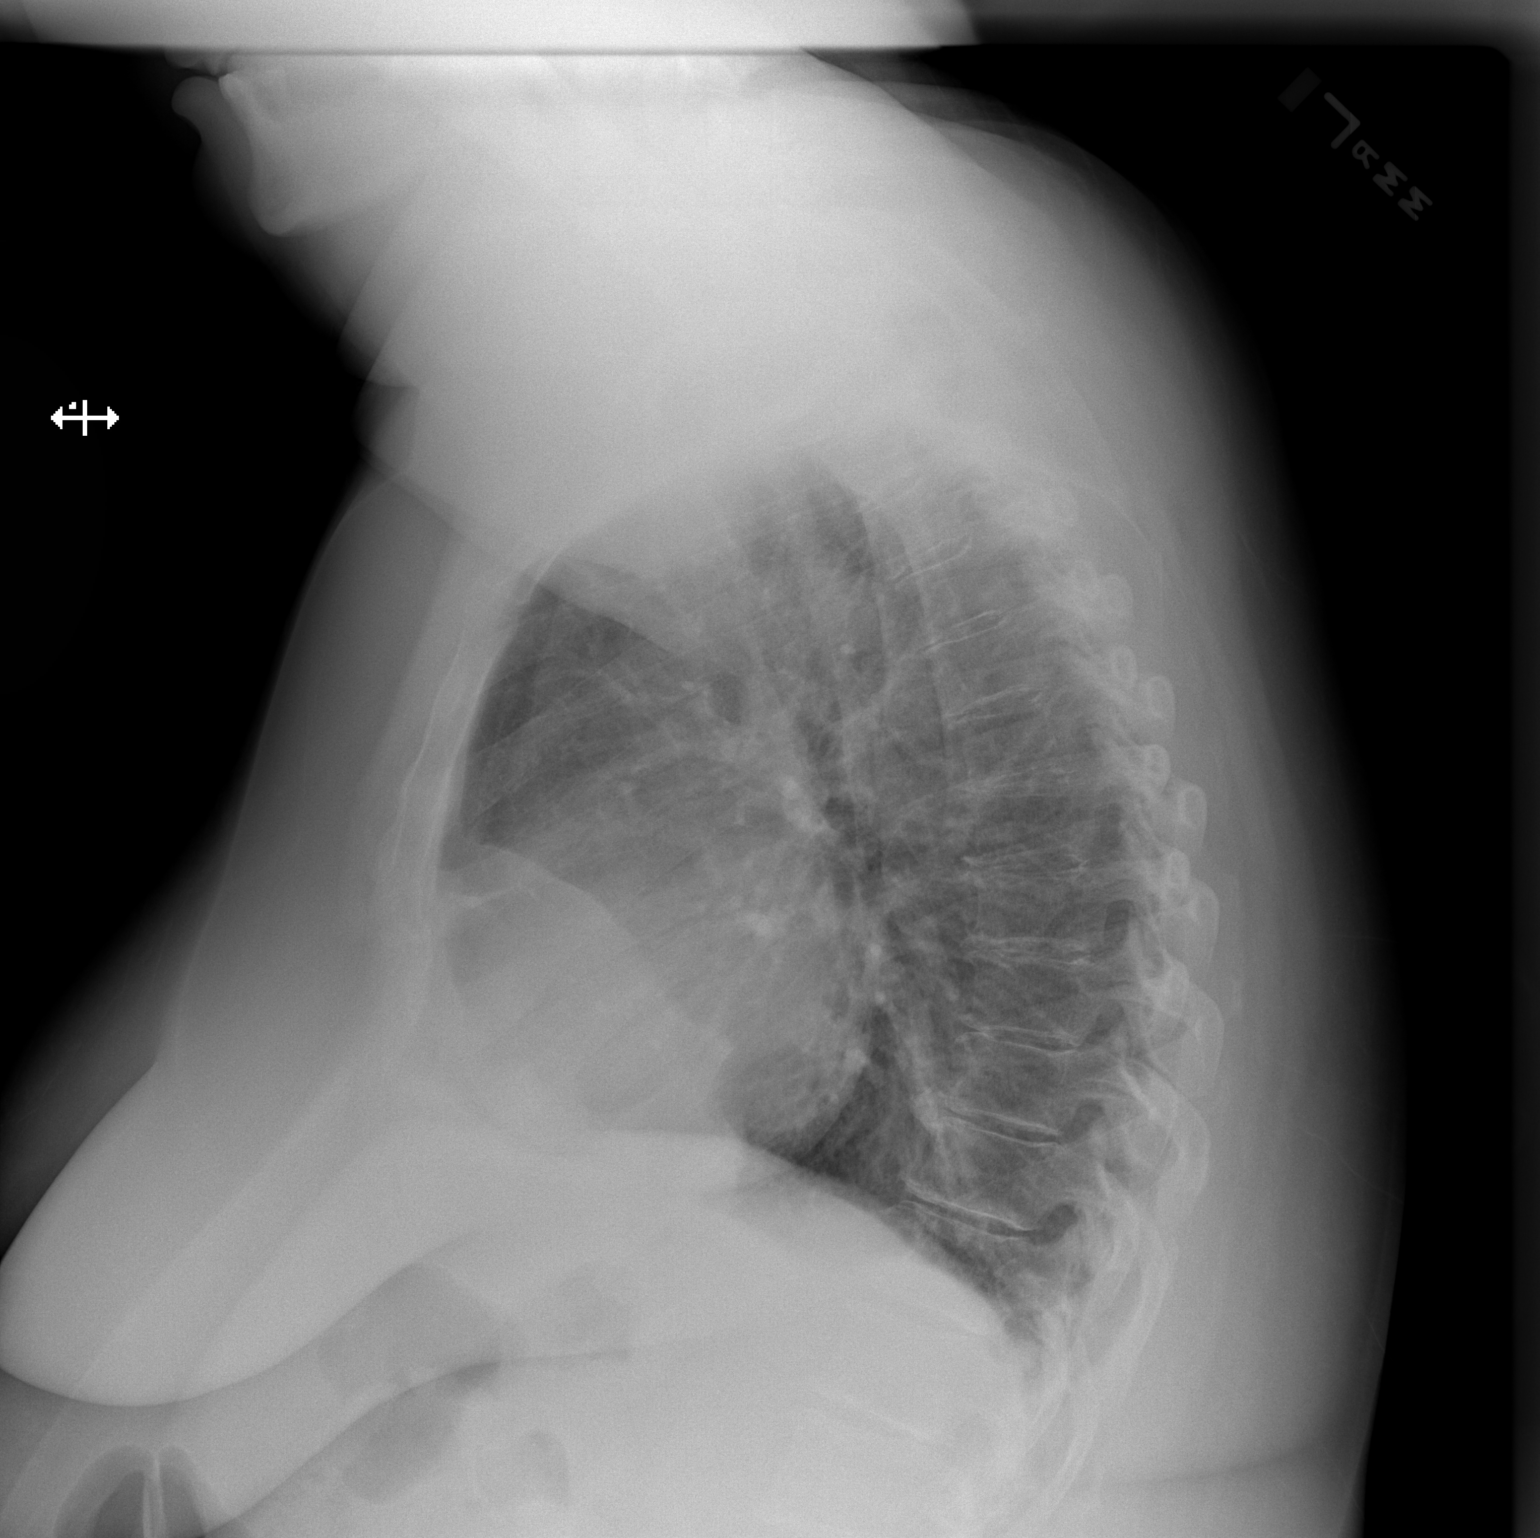

[2 of 2 positions shown; findings below may reference images not displayed]

FINDINGS: Mild cardiac enlargement is noted.  The there is no edema
or effusion to suggest failure.  Degenerative changes of the
thoracolumbar spine are stable.
IMPRESSION: 1.  Borderline cardiomegaly without failure.
2.  No acute cardiopulmonary disease.

## 2013-08-14 ENCOUNTER — Other Ambulatory Visit: Payer: Self-pay | Admitting: Internal Medicine

## 2013-08-14 ENCOUNTER — Ambulatory Visit
Admission: RE | Admit: 2013-08-14 | Discharge: 2013-08-14 | Disposition: A | Discharge status: Home / Self Care | Payer: Commercial Managed Care - HMO | Source: Ambulatory Visit | Attending: Internal Medicine | Admitting: Internal Medicine

## 2013-08-14 DIAGNOSIS — N6310 Unspecified lump in the right breast, unspecified quadrant: Secondary | ICD-10-CM

## 2013-11-13 IMAGING — US US ABDOMEN COMPLETE
1 series · 14 of 25 positions shown · non-contrast
Comparison: Ultrasound of the abdomen of 08/30/2006

CLINICAL DATA: Dyspepsia, emphysema, hypertension

COMPLETE ABDOMINAL ULTRASOUND

[Series 1: us abdomen complete · 0.34mm/px · 14 of 83 slices shown]
[im 1/83]
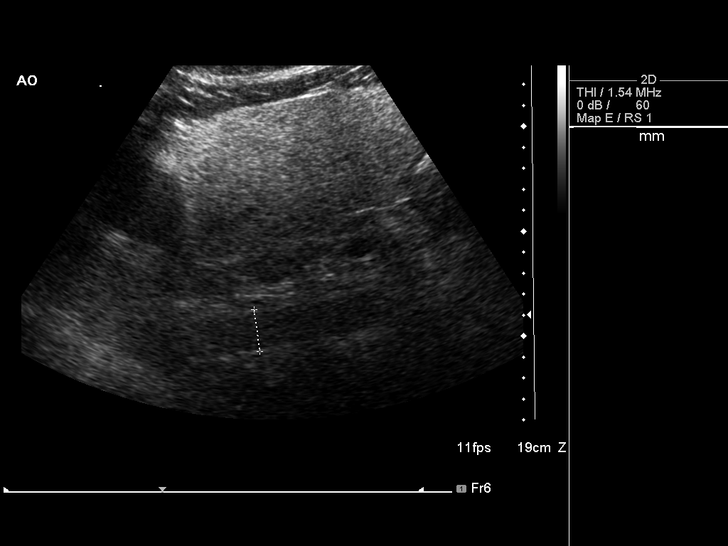
[im 7/83]
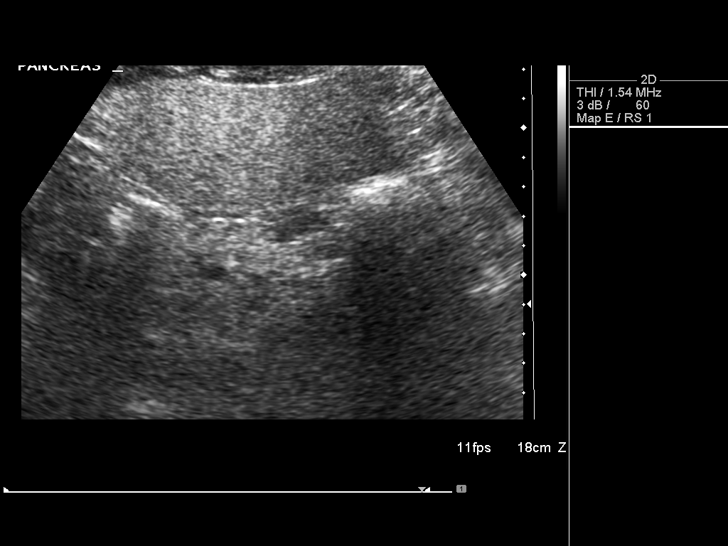
[im 14/83]
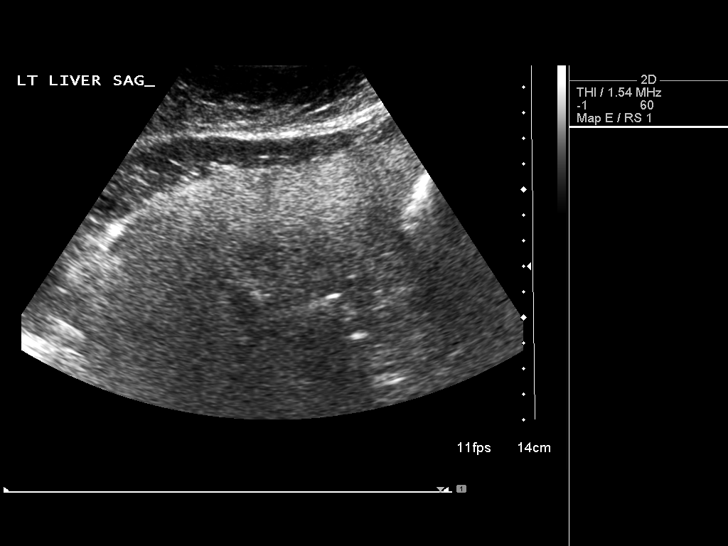
[im 21/83]
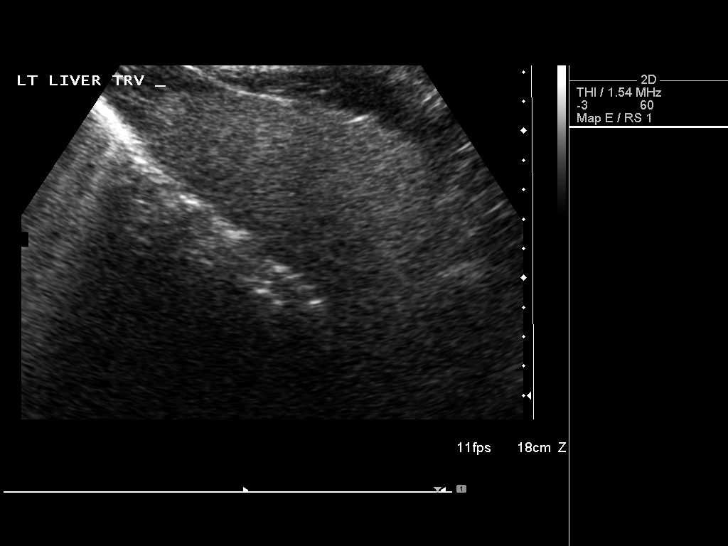
[im 28/83]
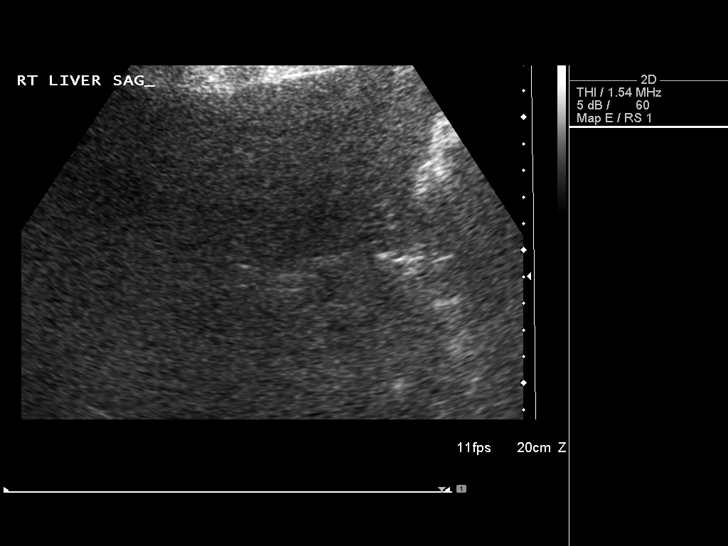
[im 31/83]
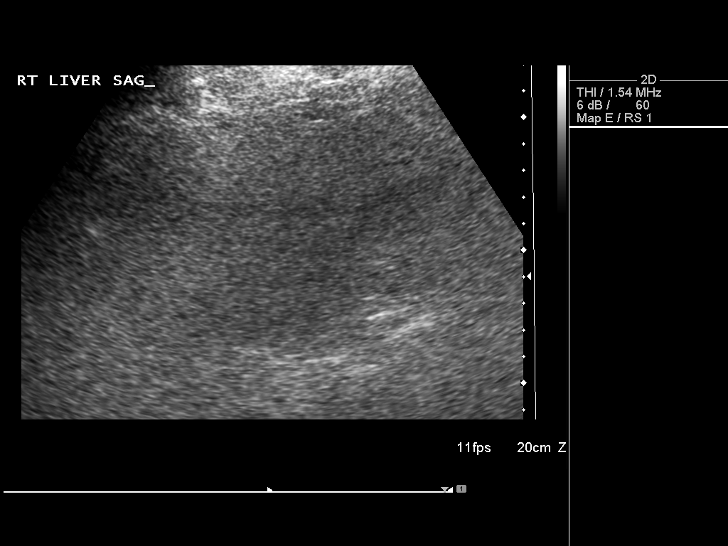
[im 38/83]
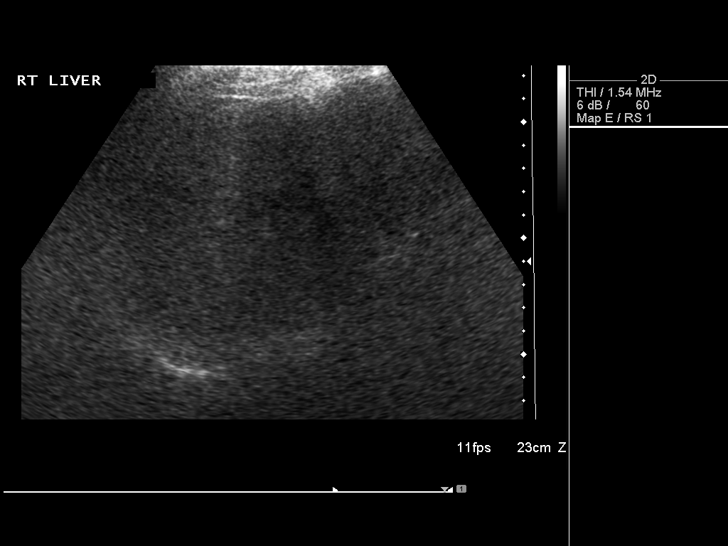
[im 45/83]
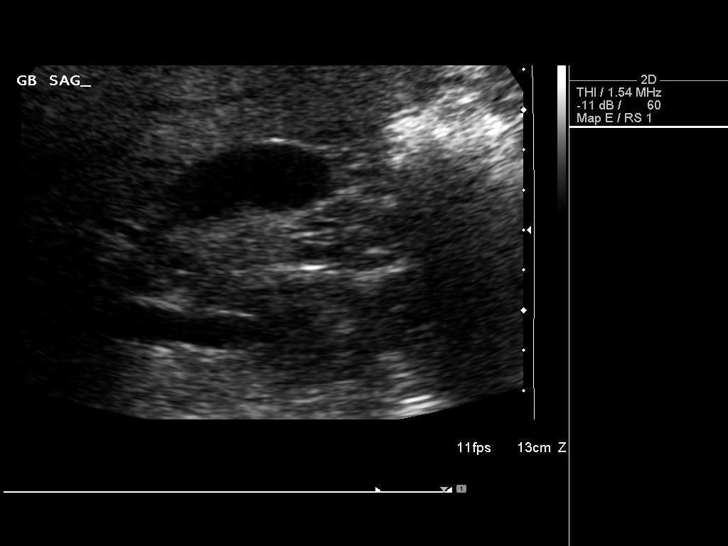
[im 52/83]
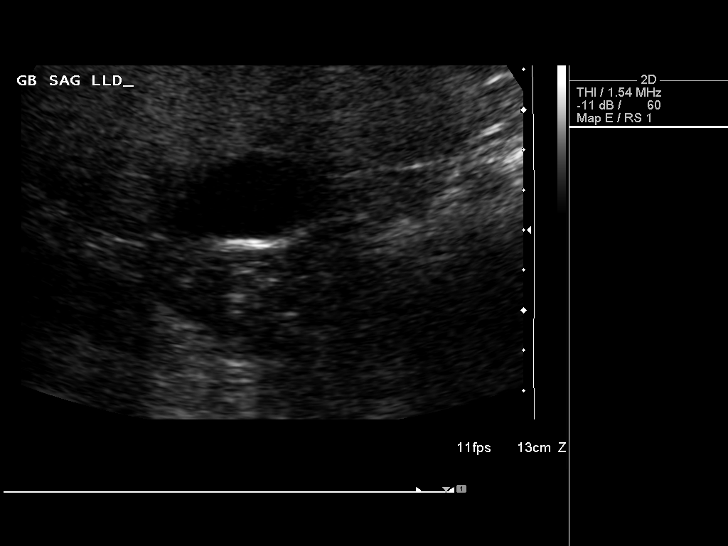
[im 55/83]
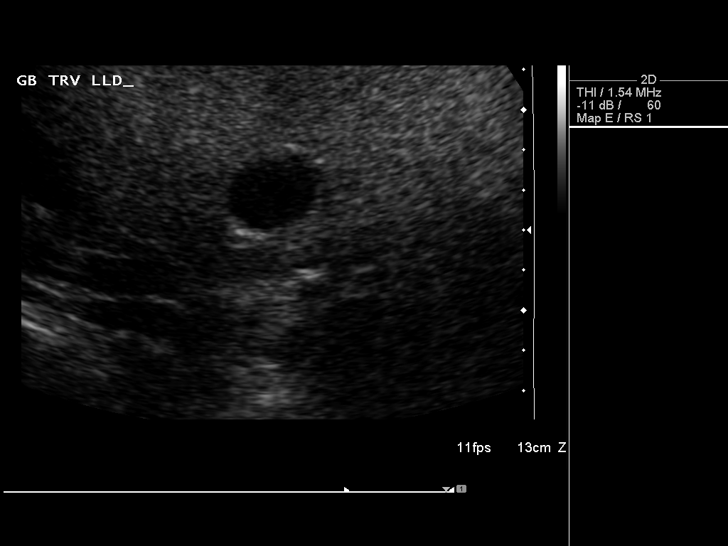
[im 62/83]
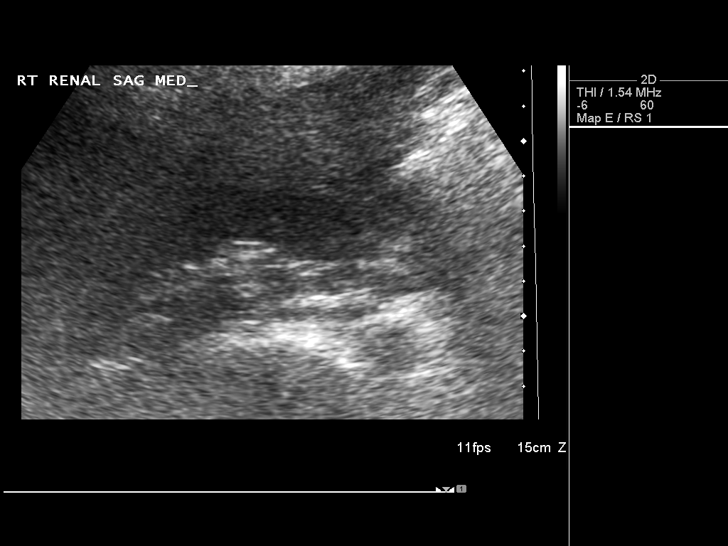
[im 69/83]
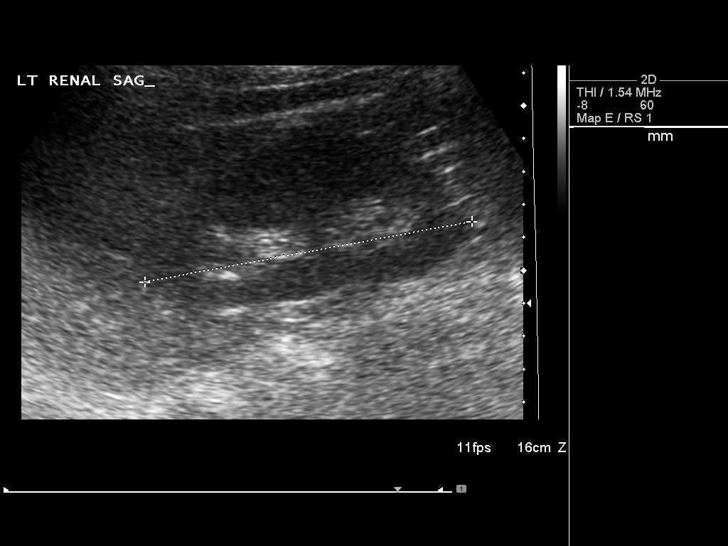
[im 76/83]
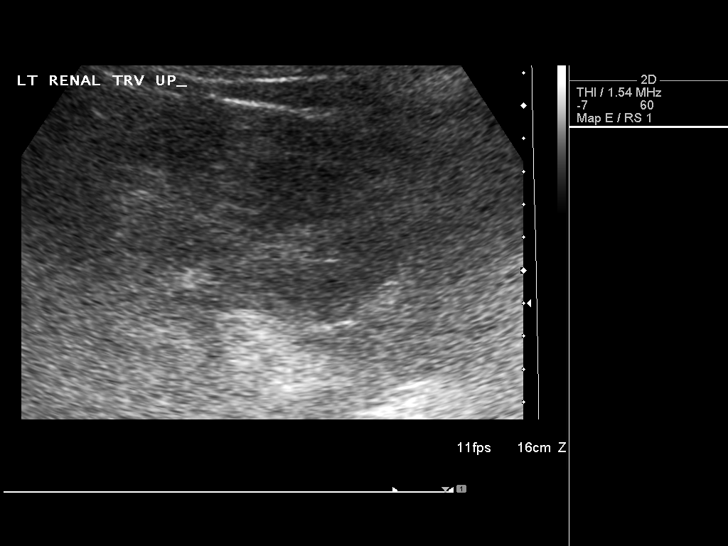
[im 83/83]
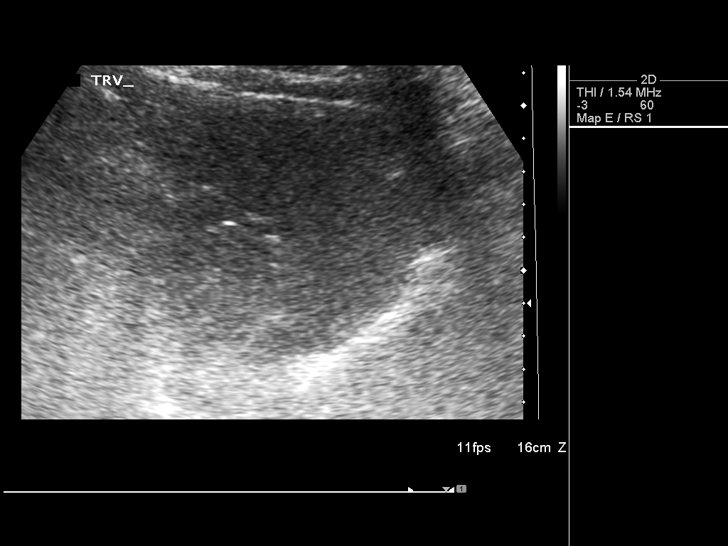

[14 of 25 positions shown; findings below may reference images not displayed]

FINDINGS: Gallbladder:  The gallbladder is visualized and no gallstones are
noted.  There is no pain over the gallbladder with compression.

Common bile duct:  The common bile duct is normal measuring 4.4 mm
in diameter.

Liver:  The liver is echogenic consistent with fatty infiltration.
A 7 mm cyst is noted in the left lobe anteriorly.

IVC:  The IVC is obscured by bowel gas.

Pancreas:  No focal abnormality seen.

Spleen:  The spleen is normal measuring 7.5 cm sagittally.

Right Kidney:  No hydronephrosis is seen.  The right kidney
measures 9.7 cm sagittally.

Left Kidney:  No hydronephrosis is noted.  The left kidney measures
10.1 cm.

Abdominal aorta:  The abdominal aorta is normal in caliber although
obscured distally by bowel gas.
IMPRESSION: 1.  Echogenic liver consistent with fatty infiltration.
2.  No gallstones.

## 2013-12-31 ENCOUNTER — Ambulatory Visit (HOSPITAL_COMMUNITY)
Admission: RE | Admit: 2013-12-31 | Discharge: 2013-12-31 | Disposition: A | Discharge status: Home / Self Care | Payer: Commercial Managed Care - HMO | Source: Ambulatory Visit | Attending: Internal Medicine | Admitting: Internal Medicine

## 2013-12-31 ENCOUNTER — Other Ambulatory Visit (HOSPITAL_COMMUNITY): Payer: Self-pay | Admitting: Internal Medicine

## 2013-12-31 DIAGNOSIS — J449 Chronic obstructive pulmonary disease, unspecified: Secondary | ICD-10-CM | POA: Insufficient documentation

## 2013-12-31 DIAGNOSIS — R0602 Shortness of breath: Secondary | ICD-10-CM | POA: Insufficient documentation

## 2013-12-31 DIAGNOSIS — J440 Chronic obstructive pulmonary disease with acute lower respiratory infection: Secondary | ICD-10-CM

## 2015-12-05 ENCOUNTER — Inpatient Hospital Stay (HOSPITAL_COMMUNITY)
Admission: EM | Admit: 2015-12-05 | Discharge: 2015-12-16 | DRG: 208 | Disposition: E | Payer: Medicare HMO | Attending: Internal Medicine | Admitting: Internal Medicine

## 2015-12-05 ENCOUNTER — Encounter (HOSPITAL_COMMUNITY): Payer: Self-pay

## 2015-12-05 ENCOUNTER — Emergency Department (HOSPITAL_COMMUNITY): Payer: Medicare HMO

## 2015-12-05 ENCOUNTER — Inpatient Hospital Stay (HOSPITAL_COMMUNITY): Payer: Medicare HMO

## 2015-12-05 DIAGNOSIS — E874 Mixed disorder of acid-base balance: Secondary | ICD-10-CM | POA: Diagnosis present

## 2015-12-05 DIAGNOSIS — J9601 Acute respiratory failure with hypoxia: Secondary | ICD-10-CM | POA: Diagnosis present

## 2015-12-05 DIAGNOSIS — Z515 Encounter for palliative care: Secondary | ICD-10-CM | POA: Diagnosis not present

## 2015-12-05 DIAGNOSIS — A491 Streptococcal infection, unspecified site: Secondary | ICD-10-CM

## 2015-12-05 DIAGNOSIS — I468 Cardiac arrest due to other underlying condition: Secondary | ICD-10-CM | POA: Diagnosis present

## 2015-12-05 DIAGNOSIS — E7800 Pure hypercholesterolemia, unspecified: Secondary | ICD-10-CM | POA: Diagnosis present

## 2015-12-05 DIAGNOSIS — Z66 Do not resuscitate: Secondary | ICD-10-CM | POA: Diagnosis not present

## 2015-12-05 DIAGNOSIS — I509 Heart failure, unspecified: Secondary | ICD-10-CM | POA: Diagnosis present

## 2015-12-05 DIAGNOSIS — J189 Pneumonia, unspecified organism: Secondary | ICD-10-CM | POA: Diagnosis not present

## 2015-12-05 DIAGNOSIS — I110 Hypertensive heart disease with heart failure: Secondary | ICD-10-CM | POA: Diagnosis present

## 2015-12-05 DIAGNOSIS — R402431 Glasgow coma scale score 3-8, in the field [EMT or ambulance]: Secondary | ICD-10-CM | POA: Diagnosis not present

## 2015-12-05 DIAGNOSIS — R579 Shock, unspecified: Secondary | ICD-10-CM | POA: Diagnosis present

## 2015-12-05 DIAGNOSIS — E876 Hypokalemia: Secondary | ICD-10-CM | POA: Diagnosis not present

## 2015-12-05 DIAGNOSIS — J13 Pneumonia due to Streptococcus pneumoniae: Principal | ICD-10-CM | POA: Diagnosis present

## 2015-12-05 DIAGNOSIS — J9600 Acute respiratory failure, unspecified whether with hypoxia or hypercapnia: Secondary | ICD-10-CM

## 2015-12-05 DIAGNOSIS — Z6841 Body Mass Index (BMI) 40.0 and over, adult: Secondary | ICD-10-CM

## 2015-12-05 DIAGNOSIS — E669 Obesity, unspecified: Secondary | ICD-10-CM | POA: Diagnosis present

## 2015-12-05 DIAGNOSIS — N179 Acute kidney failure, unspecified: Secondary | ICD-10-CM | POA: Diagnosis present

## 2015-12-05 DIAGNOSIS — F17210 Nicotine dependence, cigarettes, uncomplicated: Secondary | ICD-10-CM | POA: Diagnosis present

## 2015-12-05 DIAGNOSIS — G4733 Obstructive sleep apnea (adult) (pediatric): Secondary | ICD-10-CM | POA: Diagnosis present

## 2015-12-05 DIAGNOSIS — G9341 Metabolic encephalopathy: Secondary | ICD-10-CM | POA: Diagnosis present

## 2015-12-05 DIAGNOSIS — Z4659 Encounter for fitting and adjustment of other gastrointestinal appliance and device: Secondary | ICD-10-CM

## 2015-12-05 DIAGNOSIS — I469 Cardiac arrest, cause unspecified: Secondary | ICD-10-CM

## 2015-12-05 DIAGNOSIS — R4020 Unspecified coma: Secondary | ICD-10-CM

## 2015-12-05 LAB — I-STAT ARTERIAL BLOOD GAS, ED
Acid-base deficit: 15 mmol/L — ABNORMAL HIGH (ref 0.0–2.0)
Bicarbonate: 16.2 mmol/L — ABNORMAL LOW (ref 20.0–28.0)
O2 SAT: 98 %
PCO2 ART: 59.3 mmHg — AB (ref 32.0–48.0)
PH ART: 7.043 — AB (ref 7.350–7.450)
TCO2: 18 mmol/L (ref 0–100)
pO2, Arterial: 143 mmHg — ABNORMAL HIGH (ref 83.0–108.0)

## 2015-12-05 LAB — COMPREHENSIVE METABOLIC PANEL
ALBUMIN: 1.9 g/dL — AB (ref 3.5–5.0)
ALT: 369 U/L — ABNORMAL HIGH (ref 14–54)
AST: 712 U/L — AB (ref 15–41)
Alkaline Phosphatase: 167 U/L — ABNORMAL HIGH (ref 38–126)
Anion gap: 21 — ABNORMAL HIGH (ref 5–15)
BILIRUBIN TOTAL: 2.1 mg/dL — AB (ref 0.3–1.2)
BUN: 33 mg/dL — AB (ref 6–20)
CO2: 18 mmol/L — AB (ref 22–32)
Calcium: 7.9 mg/dL — ABNORMAL LOW (ref 8.9–10.3)
Chloride: 104 mmol/L (ref 101–111)
Creatinine, Ser: 1.64 mg/dL — ABNORMAL HIGH (ref 0.44–1.00)
GFR calc Af Amer: 37 mL/min — ABNORMAL LOW (ref >60)
GFR calc non Af Amer: 32 mL/min — ABNORMAL LOW (ref >60)
GLUCOSE: 128 mg/dL — AB (ref 65–99)
POTASSIUM: 4.2 mmol/L (ref 3.5–5.1)
SODIUM: 143 mmol/L (ref 135–145)
TOTAL PROTEIN: 5.7 g/dL — AB (ref 6.5–8.1)

## 2015-12-05 LAB — POCT I-STAT 3, ART BLOOD GAS (G3+)
Acid-base deficit: 16 mmol/L — ABNORMAL HIGH (ref 0.0–2.0)
Bicarbonate: 17.2 mmol/L — ABNORMAL LOW (ref 20.0–28.0)
O2 SAT: 97 %
PCO2 ART: 72.8 mmHg — AB (ref 32.0–48.0)
PO2 ART: 147 mmHg — AB (ref 83.0–108.0)
Patient temperature: 98.6
TCO2: 19 mmol/L (ref 0–100)
pH, Arterial: 6.981 — CL (ref 7.350–7.450)

## 2015-12-05 LAB — CBC WITH DIFFERENTIAL/PLATELET
BAND NEUTROPHILS: 0 %
BLASTS: 0 %
Basophils Absolute: 0 10*3/uL (ref 0.0–0.1)
Basophils Relative: 0 %
Eosinophils Absolute: 0.5 10*3/uL (ref 0.0–0.7)
Eosinophils Relative: 1 %
HEMATOCRIT: 48.4 % — AB (ref 36.0–46.0)
HEMOGLOBIN: 14.7 g/dL (ref 12.0–15.0)
LYMPHS ABS: 7.3 10*3/uL — AB (ref 0.7–4.0)
LYMPHS PCT: 15 %
MCH: 31.5 pg (ref 26.0–34.0)
MCHC: 30.4 g/dL (ref 30.0–36.0)
MCV: 103.6 fL — AB (ref 78.0–100.0)
MONOS PCT: 3 %
Metamyelocytes Relative: 0 %
Monocytes Absolute: 1.5 10*3/uL — ABNORMAL HIGH (ref 0.1–1.0)
Myelocytes: 0 %
NEUTROS ABS: 39.6 10*3/uL — AB (ref 1.7–7.7)
NEUTROS PCT: 81 %
NRBC: 0 /100{WBCs}
PROMYELOCYTES ABS: 0 %
Platelets: 309 10*3/uL (ref 150–400)
RBC: 4.67 MIL/uL (ref 3.87–5.11)
RDW: 13.3 % (ref 11.5–15.5)
WBC: 48.9 10*3/uL — AB (ref 4.0–10.5)

## 2015-12-05 LAB — I-STAT CHEM 8, ED
BUN: 44 mg/dL — ABNORMAL HIGH (ref 6–20)
CALCIUM ION: 0.98 mmol/L — AB (ref 1.15–1.40)
CHLORIDE: 106 mmol/L (ref 101–111)
Creatinine, Ser: 1.2 mg/dL — ABNORMAL HIGH (ref 0.44–1.00)
GLUCOSE: 125 mg/dL — AB (ref 65–99)
HCT: 47 % — ABNORMAL HIGH (ref 36.0–46.0)
HEMOGLOBIN: 16 g/dL — AB (ref 12.0–15.0)
POTASSIUM: 3.7 mmol/L (ref 3.5–5.1)
SODIUM: 143 mmol/L (ref 135–145)
TCO2: 19 mmol/L (ref 0–100)

## 2015-12-05 LAB — URINE MICROSCOPIC-ADD ON

## 2015-12-05 LAB — TROPONIN I
TROPONIN I: 0.23 ng/mL — AB (ref <0.03)
Troponin I: 0.22 ng/mL (ref <0.03)

## 2015-12-05 LAB — I-STAT CG4 LACTIC ACID, ED: LACTIC ACID, VENOUS: 14.88 mmol/L — AB (ref 0.5–1.9)

## 2015-12-05 LAB — URINALYSIS, ROUTINE W REFLEX MICROSCOPIC
Glucose, UA: NEGATIVE mg/dL
Ketones, ur: 15 mg/dL — AB
NITRITE: POSITIVE — AB
PROTEIN: 100 mg/dL — AB
SPECIFIC GRAVITY, URINE: 1.025 (ref 1.005–1.030)
pH: 6 (ref 5.0–8.0)

## 2015-12-05 LAB — MRSA PCR SCREENING: MRSA by PCR: NEGATIVE

## 2015-12-05 LAB — LACTIC ACID, PLASMA: LACTIC ACID, VENOUS: 9.4 mmol/L — AB (ref 0.5–1.9)

## 2015-12-05 LAB — ACETAMINOPHEN LEVEL: Acetaminophen (Tylenol), Serum: <10 ug/mL — ABNORMAL LOW (ref 10–30)

## 2015-12-05 LAB — I-STAT TROPONIN, ED: Troponin i, poc: 0.36 ng/mL (ref 0.00–0.08)

## 2015-12-05 LAB — BRAIN NATRIURETIC PEPTIDE: B NATRIURETIC PEPTIDE 5: 357.9 pg/mL — AB (ref 0.0–100.0)

## 2015-12-05 LAB — ETHANOL: Alcohol, Ethyl (B): <5 mg/dL (ref <5)

## 2015-12-05 LAB — LIPASE, BLOOD: Lipase: 20 U/L (ref 11–51)

## 2015-12-05 LAB — SALICYLATE LEVEL: SALICYLATE LVL: 9.2 mg/dL (ref 2.8–30.0)

## 2015-12-05 MED ORDER — FENTANYL CITRATE (PF) 100 MCG/2ML IJ SOLN: 25.0000 ug | INTRAMUSCULAR | Status: DC | PRN

## 2015-12-05 MED ORDER — VANCOMYCIN HCL IN DEXTROSE 750-5 MG/150ML-% IV SOLN
750.0000 mg | Freq: Two times a day (BID) | INTRAVENOUS | Status: DC
  Administered 2015-12-06: 750 mg via INTRAVENOUS
  Filled 2015-12-05 (×3): qty 150

## 2015-12-05 MED ORDER — IPRATROPIUM-ALBUTEROL 0.5-2.5 (3) MG/3ML IN SOLN
3.0000 mL | Freq: Four times a day (QID) | RESPIRATORY_TRACT | Status: DC
  Administered 2015-12-05 – 2015-12-06 (×4): 3 mL via RESPIRATORY_TRACT
  Filled 2015-12-05 (×4): qty 3

## 2015-12-05 MED ORDER — DEXTROSE 5 % IV SOLN
2.0000 g | Freq: Two times a day (BID) | INTRAVENOUS | Status: DC
  Administered 2015-12-05: 2 g via INTRAVENOUS
  Filled 2015-12-05 (×2): qty 2

## 2015-12-05 MED ORDER — CHLORHEXIDINE GLUCONATE 0.12% ORAL RINSE (MEDLINE KIT)
15.0000 mL | Freq: Two times a day (BID) | OROMUCOSAL | Status: DC
  Administered 2015-12-05 – 2015-12-06 (×2): 15 mL via OROMUCOSAL

## 2015-12-05 MED ORDER — EPINEPHRINE PF 1 MG/ML IJ SOLN
0.5000 ug/min | INTRAVENOUS | Status: DC
  Administered 2015-12-05 (×2): 18 ug/min via INTRAVENOUS
  Administered 2015-12-05: 2 ug/min via INTRAVENOUS
  Administered 2015-12-06 (×2): 18 ug/min via INTRAVENOUS
  Filled 2015-12-05 (×8): qty 4

## 2015-12-05 MED ORDER — ALBUTEROL SULFATE (2.5 MG/3ML) 0.083% IN NEBU: 2.5000 mg | INHALATION_SOLUTION | RESPIRATORY_TRACT | Status: DC | PRN

## 2015-12-05 MED ORDER — ASPIRIN 300 MG RE SUPP: 300.0000 mg | RECTAL | Status: AC

## 2015-12-05 MED ORDER — FAMOTIDINE IN NACL 20-0.9 MG/50ML-% IV SOLN
20.0000 mg | Freq: Two times a day (BID) | INTRAVENOUS | Status: DC
  Administered 2015-12-05 – 2015-12-06 (×3): 20 mg via INTRAVENOUS
  Filled 2015-12-05 (×3): qty 50

## 2015-12-05 MED ORDER — NOREPINEPHRINE BITARTRATE 1 MG/ML IV SOLN: 2.0000 ug/kg/min | Freq: Once | INTRAVENOUS | Status: DC

## 2015-12-05 MED ORDER — ASPIRIN 81 MG PO CHEW
324.0000 mg | CHEWABLE_TABLET | ORAL | Status: AC
  Administered 2015-12-05: 324 mg via ORAL
  Filled 2015-12-05: qty 4

## 2015-12-05 MED ORDER — NOREPINEPHRINE BITARTRATE 1 MG/ML IV SOLN
0.0000 ug/min | INTRAVENOUS | Status: DC
  Administered 2015-12-05: 4 ug/min via INTRAVENOUS
  Filled 2015-12-05: qty 4

## 2015-12-05 MED ORDER — STERILE WATER FOR INJECTION IV SOLN
INTRAVENOUS | Status: DC
  Administered 2015-12-05 – 2015-12-06 (×2): via INTRAVENOUS
  Filled 2015-12-05 (×5): qty 850

## 2015-12-05 MED ORDER — DEXTROSE 5 % IV SOLN
2.0000 g | Freq: Once | INTRAVENOUS | Status: AC
  Administered 2015-12-05: 2 g via INTRAVENOUS
  Filled 2015-12-05: qty 2

## 2015-12-05 MED ORDER — SODIUM BICARBONATE 8.4 % IV SOLN
100.0000 meq | Freq: Once | INTRAVENOUS | Status: AC
  Administered 2015-12-05: 100 meq via INTRAVENOUS
  Filled 2015-12-05: qty 50

## 2015-12-05 MED ORDER — SODIUM CHLORIDE 0.9 % IV BOLUS (SEPSIS)
1000.0000 mL | Freq: Once | INTRAVENOUS | Status: AC
  Administered 2015-12-05: 1000 mL via INTRAVENOUS

## 2015-12-05 MED ORDER — SODIUM CHLORIDE 0.9 % IV SOLN: 250.0000 mL | INTRAVENOUS | Status: DC | PRN

## 2015-12-05 MED ORDER — ONDANSETRON HCL 4 MG/2ML IJ SOLN: 4.0000 mg | Freq: Four times a day (QID) | INTRAMUSCULAR | Status: DC | PRN

## 2015-12-05 MED ORDER — NOREPINEPHRINE BITARTRATE 1 MG/ML IV SOLN
50.0000 ug/min | INTRAVENOUS | Status: DC
  Administered 2015-12-05: 30 ug/min via INTRAVENOUS
  Administered 2015-12-06: 50 ug/min via INTRAVENOUS
  Administered 2015-12-06: 30 ug/min via INTRAVENOUS
  Filled 2015-12-05 (×4): qty 16

## 2015-12-05 MED ORDER — SODIUM BICARBONATE 8.4 % IV SOLN
100.0000 meq | Freq: Once | INTRAVENOUS | Status: AC
  Administered 2015-12-05: 100 meq via INTRAVENOUS

## 2015-12-05 MED ORDER — ACETAMINOPHEN 325 MG PO TABS: 650.0000 mg | ORAL_TABLET | ORAL | Status: DC | PRN

## 2015-12-05 MED ORDER — VANCOMYCIN HCL IN DEXTROSE 1-5 GM/200ML-% IV SOLN
1000.0000 mg | Freq: Once | INTRAVENOUS | Status: AC
  Administered 2015-12-05: 1000 mg via INTRAVENOUS
  Filled 2015-12-05: qty 200

## 2015-12-05 MED ORDER — MORPHINE SULFATE (PF) 4 MG/ML IV SOLN: 1.0000 mg | INTRAVENOUS | Status: DC | PRN

## 2015-12-05 MED ORDER — FENTANYL 2500MCG IN NS 250ML (10MCG/ML) PREMIX INFUSION: 10.0000 ug/h | INTRAVENOUS | Status: DC

## 2015-12-05 MED ORDER — SODIUM BICARBONATE 8.4 % IV SOLN
INTRAVENOUS | Status: AC
Start: 2015-12-05 — End: 2015-12-05
  Filled 2015-12-05: qty 100

## 2015-12-05 MED ORDER — ORAL CARE MOUTH RINSE
15.0000 mL | OROMUCOSAL | Status: DC
  Administered 2015-12-05 – 2015-12-06 (×8): 15 mL via OROMUCOSAL

## 2015-12-05 MED ORDER — SODIUM BICARBONATE 8.4 % IV SOLN
INTRAVENOUS | Status: AC
  Filled 2015-12-05: qty 50

## 2015-12-05 MED ORDER — SODIUM CHLORIDE 0.9 % IV SOLN: INTRAVENOUS | Status: DC

## 2015-12-05 NOTE — ED Triage Notes (Signed)
Pt. Is a 63 yr. Old female that was found done on her bedroom floor.  Husband heard a thud and found her in Asystole.  Pt. 's husband was unable to start CPR.  Timefram: Call went out at 9:00 9:10 FD arrived and began CPR 9:15 [place] paramedics arrived continued CPR . 2 epis  9:30  ROSC obtained. ST on monitor 9:35 Lost pulses. CPR started 3 epis 9:45 ROSC obtained.   Pt. Is in a ST 110 and is receiving an EPI drip 4 mcg/min Pt. Also received 1500 NSS bolus.  Upon arrival to Trauma C, pt is unresponsiive,  ROSC maintained.  Pt. Has an 8.0 ET tube taped at 22 lip.  Skin is warm and grey upon arrival. ST 112 on the monitor and BP manual 88/66.

## 2015-12-05 NOTE — ED Notes (Signed)
Reported to Kal , RT and Dr. Clarene DukeLittle that pt. Has pink frothy  Drainage in her ET tube and vent tubing

## 2015-12-05 NOTE — ED Notes (Signed)
Dr. Clarene DukeLittle is aware of unable to take pt. To CT, BP is unstable.

## 2015-12-05 NOTE — Progress Notes (Signed)
Patient came in intubated by EMS with a 8.0 ETT secured at 22 cm at lip good BBS ausculted over lung fields, SATS 95% etCO2 53, X-Ray confirmed placement. Placed on above vent settings MD aware.

## 2015-12-05 NOTE — ED Provider Notes (Addendum)
[facility]-EMERGENCY DEPT Provider Note   CSN: [id number] Arrival date & time: 12-08-15  1016     History   Chief Complaint Chief Complaint  Patient presents with  . Cardiac Arrest    HPI Kaylee Pittman is a 65 y.o. female.  65yo F w/ PMH including COPD, CHF, HTN, HLD, OSA who p/w unresponsiveness. Per husband, pt wasn't feeling well yesterday. This morning, she was in the bedroom and he was elsewhere and heard a "thud." He went 15 min later to check on patient and she was unresponsive and wedged between bed and nightstand. CPR was initiated by first responders who arrived approximately 5 minutes after the patient's husband called. EMS reports that initial rhythm was asystole. She had several rounds of CPR and 2 doses of epinephrine followed by ROSC. Later lost pulses again and repeat doses of epi led to ROSC again. Epi drip initiated and pt intubated prior to arrival.   LEVEL 5 CAVEAT DUE TO UNRESPONSIVENESS   The history is provided by the EMS personnel.  Cardiac Arrest    Past Medical History:  Diagnosis Date  . CHF (congestive heart failure) (HCC)   . COPD (chronic obstructive pulmonary disease) (HCC)   . Emphysema   . Hypercholesteremia   . Hypertension   . Pneumonia   . Sleep apnea     Patient Active Problem List   Diagnosis Date Noted  . COPD (chronic obstructive pulmonary disease) (HCC) 10/24/2011  . Obesity 10/24/2011  . Pulmonary hypertension 10/24/2011  . Tobacco abuse 10/24/2011  . Hypertension 10/24/2011    Past Surgical History:  Procedure Laterality Date  . APPENDECTOMY    . BACK SURGERY    . CESAREAN SECTION    . TUBAL LIGATION      OB History    No data available       Home Medications    Prior to Admission medications   Medication Sig Start Date End Date Taking? Authorizing Provider  albuterol (PROVENTIL HFA;VENTOLIN HFA) 108 (90 BASE) MCG/ACT inhaler Inhale 1-2 puffs into the lungs every 6 (six) hours as needed. 05/02/11 2015/12/08 Yes  Elijio Miles, MD  buPROPion [facility] SR) 150 MG 12 hr tablet Take 150 mg by mouth at bedtime.   Yes Historical Provider, MD  furosemide (LASIX) 40 MG tablet Take 40 mg by mouth daily as needed for fluid.   Yes Historical Provider, MD  mometasone-formoterol (DULERA) 100-5 MCG/ACT AERO Inhale 2 puffs into the lungs daily.   Yes Historical Provider, MD  venlafaxine XR (EFFEXOR-XR) 75 MG 24 hr capsule Take 75 mg by mouth at bedtime.   Yes Historical Provider, MD    Family History No family history on file.  Social History Social History  Substance Use Topics  . Smoking status: Current Every Day Smoker    Packs/day: 1.50    Years: 34.00    Types: Cigarettes  . Smokeless tobacco: Never Used  . Alcohol use No     Allergies   Sulfa antibiotics   Review of Systems Review of Systems  Unable to perform ROS: Intubated     Physical Exam Updated Vital Signs BP (!) 88/73   Pulse 80   Temp 97.7 F (36.5 C) (Axillary)   Resp 20   Ht 4\' 7"  (1.397 m)   Wt 206 lb (93.4 kg)   LMP 08/15/2000   SpO2 98%   BMI 47.88 kg/m   Physical Exam  Constitutional:  Intubated, unresponsive  HENT:  Head: Normocephalic and atraumatic.  Moist  mucous membranes  Eyes: Conjunctivae are normal.  R pupil 7mm nonreactive, L pupil 6mm nonreactive  Neck: Neck supple.  Cardiovascular: Regular rhythm and normal heart sounds.  Tachycardia present.   No murmur heard. Pulmonary/Chest:  No spontaneous respirations Equal and coarse BS b/l  Abdominal: Soft. Bowel sounds are normal. She exhibits no distension.  Musculoskeletal: She exhibits no edema or deformity.  Neurological:  GCS3  Skin: Skin is dry.  cool  Nursing note and vitals reviewed.    ED Treatments / Results  Labs (all labs ordered are listed, but only abnormal results are displayed) Labs Reviewed  ACETAMINOPHEN LEVEL - Abnormal; Notable for the following:       Result Value   Acetaminophen (Tylenol), Serum <10 (*)    All other  components within normal limits  COMPREHENSIVE METABOLIC PANEL - Abnormal; Notable for the following:    CO2 18 (*)    Glucose, Bld 128 (*)    BUN 33 (*)    Creatinine, Ser 1.64 (*)    Calcium 7.9 (*)    Total Protein 5.7 (*)    Albumin 1.9 (*)    AST 712 (*)    ALT 369 (*)    Alkaline Phosphatase 167 (*)    Total Bilirubin 2.1 (*)    GFR calc non Af Amer 32 (*)    GFR calc Af Amer 37 (*)    Anion gap 21 (*)    All other components within normal limits  BRAIN NATRIURETIC PEPTIDE - Abnormal; Notable for the following:    B Natriuretic Peptide 357.9 (*)    All other components within normal limits  CBC WITH DIFFERENTIAL/PLATELET - Abnormal; Notable for the following:    WBC 48.9 (*)    HCT 48.4 (*)    MCV 103.6 (*)    Neutro Abs 39.6 (*)    Lymphs Abs 7.3 (*)    Monocytes Absolute 1.5 (*)    All other components within normal limits  URINALYSIS, ROUTINE W REFLEX MICROSCOPIC (NOT AT [facility]) - Abnormal; Notable for the following:    Color, Urine ORANGE (*)    APPearance CLOUDY (*)    Hgb urine dipstick LARGE (*)    Bilirubin Urine MODERATE (*)    Ketones, ur 15 (*)    Protein, ur 100 (*)    Nitrite POSITIVE (*)    Leukocytes, UA TRACE (*)    All other components within normal limits  URINE MICROSCOPIC-ADD ON - Abnormal; Notable for the following:    Squamous Epithelial / LPF 6-30 (*)    Bacteria, UA RARE (*)    All other components within normal limits  I-STAT TROPOININ, ED - Abnormal; Notable for the following:    Troponin i, poc 0.36 (*)    All other components within normal limits  I-STAT CG4 LACTIC ACID, ED - Abnormal; Notable for the following:    Lactic Acid, Venous 14.88 (*)    All other components within normal limits  I-STAT CHEM 8, ED - Abnormal; Notable for the following:    BUN 44 (*)    Creatinine, Ser 1.20 (*)    Glucose, Bld 125 (*)    Calcium, Ion 0.98 (*)    Hemoglobin 16.0 (*)    HCT 47.0 (*)    All other components within normal limits  I-STAT  ARTERIAL BLOOD GAS, ED - Abnormal; Notable for the following:    pH, Arterial 7.043 (*)    pCO2 arterial 59.3 (*)    pO2, Arterial  143.0 (*)    Bicarbonate 16.2 (*)    Acid-base deficit 15.0 (*)    All other components within normal limits  URINE CULTURE  CULTURE, BLOOD (ROUTINE X 2)  CULTURE, BLOOD (ROUTINE X 2)  ETHANOL  LIPASE, BLOOD  SALICYLATE LEVEL  BLOOD GAS, ARTERIAL  LACTIC ACID, PLASMA    EKG  EKG Interpretation  Date/Time:  Saturday December 05 2015 10:27:16 EDT Ventricular Rate:  108 PR Interval:    QRS Duration: 95 QT Interval:  357 QTC Calculation: 479 R Axis:   -51 Text Interpretation:  Sinus tachycardia LAD, consider left anterior fascicular block since previous tracing, new tachycardia with mild ST depression in inferior and later leads Confirmed by Mekai Wilkinson MD, Bartlomiej Jenkinson [phone]) on 11/26/2015 10:33:56 AM       Radiology Dg Chest Port 1 View  Result Date: 11/15/2015 CLINICAL DATA:  Post cardiac arrest, CPR, history of COPD, CHF and hypertension. EXAM: PORTABLE CHEST 1 VIEW COMPARISON:  12/31/2013 FINDINGS: Monitor leads overlie the chest. Endotracheal tube is 6.2 cm above the carina. Dense consolidation/ collapse of the left upper lobe. No large effusion or pneumothorax. Right lung remains clear. Degenerative changes of the spine. Heart is enlarged. IMPRESSION: Endotracheal tube is 6.2 cm above the carina. Left upper lobe dense consolidation/collapse, compatible with consolidative pneumonia. Difficult to exclude underlying central mass. Cardiomegaly Electronically Signed   By: Judie Petit.  Shick M.D.   On: 11/18/2015 10:49    Procedures .Critical Care Performed by: Laurence Spates Authorized by: Laurence Spates   Critical care provider statement:    Critical care time (minutes):  60   Critical care time was exclusive of:  Separately billable procedures and treating other patients   Critical care was necessary to treat or prevent imminent or  life-threatening deterioration of the following conditions:  Cardiac failure, respiratory failure and shock   Critical care was time spent personally by me on the following activities:  Development of treatment plan with patient or surrogate, discussions with consultants, evaluation of patient's response to treatment, examination of patient, obtaining history from patient or surrogate, ventilator management, review of old charts, re-evaluation of patient's condition, pulse oximetry, ordering and review of radiographic studies, ordering and review of laboratory studies and ordering and performing treatments and interventions   (including critical care time)  Medications Ordered in ED Medications  EPINEPHrine (ADRENALIN) 4 mg in dextrose 5 % 250 mL (0.016 mg/mL) infusion (18 mcg/min Intravenous Rate/Dose Change 11/18/2015 1339)  norepinephrine (LEVOPHED) 4 mg in dextrose 5 % 250 mL (0.016 mg/mL) infusion (30 mcg/min Intravenous Rate/Dose Change 12/15/2015 1350)  vancomycin (VANCOCIN) IVPB 750 mg/150 ml premix (not administered)  ceFEPIme (MAXIPIME) 2 g in dextrose 5 % 50 mL IVPB (not administered)  sodium chloride 0.9 % bolus 1,000 mL (0 mLs Intravenous Stopped 11/27/2015 1217)    And  sodium chloride 0.9 % bolus 1,000 mL (0 mLs Intravenous Stopped 12/07/2015 1217)    And  sodium chloride 0.9 % bolus 1,000 mL (1,000 mLs Intravenous New Bag/Given 12/12/2015 1209)  ceFEPIme (MAXIPIME) 2 g in dextrose 5 % 50 mL IVPB (0 g Intravenous Stopped 12/09/2015 1355)  vancomycin (VANCOCIN) IVPB 1000 mg/200 mL premix (0 mg Intravenous Stopped 11/28/2015 1311)     Initial Impression / Assessment and Plan / ED Course  I have reviewed the triage vital signs and the nursing notes.  Pertinent labs & imaging results that were available during my care of the patient were reviewed by me and considered in my medical  decision making (see chart for details).  Clinical Course   Pt found down by husband 15 min after hearing her  fall down, Initial rhythm was asystole and patient eventually had ROSC after CPR, intubation and multiple doses epi by EMS. She was unresponsive, intubated, and with a GCS of 3 on arrival. Pupils were slightly unequal and nonreactive. No paralytics or sedation given. Confirmed tube placement with auscultation and chest x-ray. Initiated a sepsis protocol with IV fluids, cultures, and broad-spectrum antibiotics. Continued epi drip and later added Levophed. Initial lactate 14, ABG pH 7.04 CO2 60 O2 143. CXR w/ dense L sided consolidation. EKG shows sinus tachycardia without acute ischemic changes.  Labs show elevated LFTs and bilirubin, creatinine 1.6, anion gap 21. I suspect multisystem organ failure due to shock. I have titrated up on epinephrine as well as levophed with no significant improvement in MAPs. Also gave 1amp bicarb. Head/C-spine CT has been ordered and is pending after stabilization. Discussed w/ Dr. Marchelle Gearing, CCM, who agreed with current plan and will admit patient to ICU. I discussed workup, findings, and critical illness with the patient's family.  Final Clinical Impressions(s) / ED Diagnoses   Final diagnoses:  Cardiopulmonary arrest (HCC)  Community acquired pneumonia, unspecified laterality    New Prescriptions New Prescriptions   No medications on file     Laurence Spates, MD 12/04/2015 1401    Laurence Spates, MD 11/23/2015 1402

## 2015-12-05 NOTE — Progress Notes (Signed)
Pharmacy Antibiotic Note  Kaylee Pittman is a 65 y.o. female admitted on 03-28-2015 with pneumonia.  Pharmacy has been consulted for cefepime and vancomycin dosing. Pt has history of COPD, CHF, HTN and OSA presents s/p cardia arrest.  Pt received cefepime 2g and vancomycin 1g IV once in the ED.  Plan: Vancomycin 750mg  IV every 12 hours.  Goal trough 15-20 mcg/mL.  Cefepime 2g IV q12h Monitor culture data, renal function and clinical course VT at SS prn  Height: 4\' 7"  (139.7 cm) Weight: 206 lb (93.4 kg) IBW/kg (Calculated) : 34  Temp (24hrs), Avg:97.7 F (36.5 C), Min:97.7 F (36.5 C), Max:97.7 F (36.5 C)   Recent Labs Lab 09-28-15 1035 09-28-15 1044 09-28-15 1045  WBC PENDING  --   --   CREATININE  --   --  1.20*  LATICACIDVEN  --  14.88*  --     Estimated Creatinine Clearance: 43.8 mL/min (by C-G formula based on SCr of 1.2 mg/dL (H)).    Allergies  Allergen Reactions  . Sulfa Antibiotics Hives    unknown    Antimicrobials this admission: Cefepime 10/21 >>  Vanc 10/21 >>   Dose adjustments this admission: n/a  Microbiology results: 10/21 BCx: sent  UCx:    Sputum:    MRSA PCR:    Arlean Hoppingorey M. Newman PiesBall, PharmD, BCPS Clinical Pharmacist Pager [phone] 03-28-2015 11:45 AM

## 2015-12-05 NOTE — H&P (Signed)
PULMONARY / CRITICAL CARE MEDICINE   Name: Kaylee Pittman P Abrell MRN: [id number] DOB: 08/04/1950    ADMISSION DATE:  April 03, 2015 CONSULTATION DATE:  02/21/2015  REFERRING MD:  Dr. Clarene DukeLittle   CHIEF COMPLAINT:  Cardiac Arrest  HISTORY OF PRESENT ILLNESS:  65 y/o F, smoker, with PMH of HTN, HLD, CHF, OSA, and COPD who [facility] as a cardiac arrest.    Husband reports he was in the next room and he heard a "thud".  The patients husband was unable to start CPR (he is very frail).  He called 911 at 0900.  Fire was on scene at 9:10 am.  She was found to be in asystole.  EMS arrived at 9:15 and CPR was in progress / ongoing.  She was treated with 2 epi.  At 9:30 they achieved ROSC.  The patient was ST on the monitor.  She unfortunately lost pulses again at 9:35 am.  ROSC achieved 9:45 am. She was transported to the ER for further evaluation.  On arrival to the ER, she was started on an EPI gtt and volume resuscitated.  Total down time estimated at 45 minutes.    Initial ER labs - ph 7.04 / 59 / 143 / 16, Na 143, K 4.2, CO2 18, BUN 33, Sr Cr 1.64 (last checked in system in 2013 was 0.71), glucose 128, albumin 1.9, AST 712, ALT 369, t.protein 5.7, BNP 357, troponin 0.36, lactic acid 14.88, WBC 48.9, Hgb 14.7, MVC 103, and platelets.  CXR showed dense consolidation in the LUL, cardiomegaly.  The patient was also started on Levophed.  Unfortunately, they've had difficulty maintaining adequate blood pressure.   Family reports that the patient has been complaining of feeling poorly for approximately 2 weeks. She recently was seen by her primary provider but they are unaware of details surrounding that visit. Daughter reports she had noted her mother's cough to be increased in frequency and more wet/ productive.  Husband reports the patient stayed in bed all day on 10/20. He states that when he got to her this morning she was cold.  PCCM called for ICU admission.    PAST MEDICAL HISTORY :  She  has  a past medical history of CHF (congestive heart failure) (HCC); COPD (chronic obstructive pulmonary disease) (HCC); Emphysema; Hypercholesteremia; Hypertension; Pneumonia; and Sleep apnea.  PAST SURGICAL HISTORY: She  has a past surgical history that includes Back surgery; Cesarean section; Tubal ligation; and Appendectomy.  Allergies  Allergen Reactions  . Sulfa Antibiotics Hives    unknown    No current facility-administered medications on file prior to encounter.    Current Outpatient Prescriptions on File Prior to Encounter  Medication Sig  . albuterol (PROVENTIL HFA;VENTOLIN HFA) 108 (90 BASE) MCG/ACT inhaler Inhale 1-2 puffs into the lungs every 6 (six) hours as needed.  . mometasone-formoterol (DULERA) 100-5 MCG/ACT AERO Inhale 2 puffs into the lungs daily.    FAMILY HISTORY:  Her has no family status information on file.    SOCIAL HISTORY: She  reports that she has been smoking Cigarettes.  She has a 51.00 pack-year smoking history. She has never used smokeless tobacco. She reports that she does not drink alcohol or use drugs.  REVIEW OF SYSTEMS:  Unable to complete with patient due to mechanical ventilation/altered mental status.  SUBJECTIVE: RN reports patient's BP initially improves but then declines after increase in vasopressors.  VITAL SIGNS: BP (!) 81/61   Pulse 78   Temp 97.7 F (36.5 C) (  Axillary)   Resp 22   Ht 4\' 7"  (1.397 m)   Wt 206 lb (93.4 kg)   LMP 08/15/2000   SpO2 97%   BMI 47.88 kg/m   HEMODYNAMICS:    VENTILATOR SETTINGS: Vent Mode: PRVC FiO2 (%):  [100 %] 100 % Set Rate:  [20 bmp] 20 bmp Vt Set:  [500 mL] 500 mL PEEP:  [5 cmH20] 5 cmH20 Plateau Pressure:  [27 cmH20] 27 cmH20  INTAKE / OUTPUT: No intake/output data recorded.  PHYSICAL EXAMINATION: General:  Chronically ill appearing female on mechanical ventilation Neuro:  Obtunded, pupils 2-3 MM sluggish, not overbreathing on vent, no gag with suction HEENT:  ET tube in place,  pink frothy secretions from tube Cardiovascular:  s1s2 distant, no m/r/g  Lungs:  Even/non-labored on vent, no spontaneous effort, lungs w coarse rhonchi bilaterally.  Marks on chest from [place] device Abdomen:  Obese/firm, bsx4 decreased  Musculoskeletal:  No acute deformities Skin:  Cool, dry, LE mottled  Sepsis - Repeat Assessment  Performed at:    1420  Vitals     Blood pressure (!) 62/47, pulse 80, temperature 97.7 F (36.5 C), temperature source Axillary, resp. rate 18, height 4\' 7"  (1.397 m), weight 206 lb (93.4 kg), last menstrual period 08/15/2000, SpO2 98 %.  Heart:     Regular rate and rhythm and Tachycardic  Lungs:    Rhonchi  Capillary Refill:   > 2 sec  Peripheral Pulse:   Radial pulse dopplerable  Skin:     Pale, Mottled and Dry      LABS:  BMET  Recent Labs Lab 12/03/2015 1035 12/13/2015 1045  NA 143 143  K 4.2 3.7  CL 104 106  CO2 18*  --   BUN 33* 44*  CREATININE 1.64* 1.20*  GLUCOSE 128* 125*    Electrolytes  Recent Labs Lab 12/15/2015 1035  CALCIUM 7.9*    CBC  Recent Labs Lab 12/08/2015 1035 12/11/2015 1045  WBC 48.9*  --   HGB 14.7 16.0*  HCT 48.4* 47.0*  PLT 309  --     Coag's No results for input(s): APTT, INR in the last 168 hours.  Sepsis Markers  Recent Labs Lab 12/10/2015 1044  LATICACIDVEN 14.88*    ABG  Recent Labs Lab 12/01/2015 1052  PHART 7.043*  PCO2ART 59.3*  PO2ART 143.0*    Liver Enzymes  Recent Labs Lab 11/17/2015 1035  AST 712*  ALT 369*  ALKPHOS 167*  BILITOT 2.1*  ALBUMIN 1.9*    Cardiac Enzymes No results for input(s): TROPONINI, PROBNP in the last 168 hours.  Glucose No results for input(s): GLUCAP in the last 168 hours.  Imaging Dg Chest Port 1 View  Result Date: 11/23/2015 CLINICAL DATA:  Post cardiac arrest, CPR, history of COPD, CHF and hypertension. EXAM: PORTABLE CHEST 1 VIEW COMPARISON:  12/31/2013 FINDINGS: Monitor leads overlie the chest. Endotracheal tube is 6.2 cm above the  carina. Dense consolidation/ collapse of the left upper lobe. No large effusion or pneumothorax. Right lung remains clear. Degenerative changes of the spine. Heart is enlarged. IMPRESSION: Endotracheal tube is 6.2 cm above the carina. Left upper lobe dense consolidation/collapse, compatible with consolidative pneumonia. Difficult to exclude underlying central mass. Cardiomegaly Electronically Signed   By: Judie Petit.  Shick M.D.   On: 12/12/2015 10:49     STUDIES:  CT Head 10/21 >>   CULTURES: BCx2 10/21 >>  Sputum 10/21 >>  UA 10/21 >> nitrite post, 0-5 wbc, rare bacteria  ANTIBIOTICS: Vanco 10/21 >>  Cefepime 10/21 >>   SIGNIFICANT EVENTS: 10/21  Admit after 45 minute CPR / cardiac arrest  LINES/TUBES: ETT 10/21 >>   DISCUSSION: 65 y/o F admitted 10/21 with LUL dense infiltrate, post prolonged cardiac arrest (>45 minutes) with at least 10 minutes of no CPR (if not more).    ASSESSMENT / PLAN:  PULMONARY A: Dense LUL Infiltrate  R/O Underlying Mass Acute Hypoxic Respiratory Failure  Pulmonary Edema - post arrest P:   PRVC 8 cc/kg  Wean PEEP / FiO2 for sats > 92% Assess ABG in one hour  Intermittent CXR  Scheduled duonebs Q6 + PRN albuterol   CARDIOVASCULAR A:  Cardiac Arrest - initial rhythm asystole  Suspected Sepsis - LUL infiltrate  P:  Epi + Levophed gtt's for MAP >65 Very poor prognosis, concerned she may not survive the day Not a cooling candidate due to prolonged cardiac arrest No further titration of gtt's  DNR in the event of further arrest  Assess ECHO  Trend troponin   RENAL A:   AKI - in setting of arrest  Lactic Acidosis  P:   Trend BMP / UOP  Replace electrolytes as indicated   GASTROINTESTINAL A:   Obesity  P:   NPO  Pepcid for SUP   HEMATOLOGIC A:   Leukocytosis - wbc 48k on admit  P:  Trend CBC  SCD's for DVT prophylaxis   INFECTIOUS A:   Suspected Sepsis - with arrest / shock  P:   Cultures as above  Abx as above    ENDOCRINE A:   DM    P:   SSI   NEUROLOGIC A:   Acute Metabolic Encephalopathy  P:   RASS goal: 0 CT head once stabilized  Frequent neuro exams  PRN morphine for pain / evidence of discomfort   FAMILY  - Updates: Family updated at bedside.  Husband and daughter indicate the patient would not want to be on life support if no chance of meaningful recovery. Given duration of cardiac arrest, it is very concerning that she has suffered a catastrophic event. Family aware of the gravity of circumstances. They are in agreement for no further CPR. Currently, they would like to continue full support. No further escalation of vasopressors. If she declines, comfort measures after discussion with family.  - Inter-disciplinary family meet or Palliative Care meeting due by:  10/28    Canary Brim, NP-C [facility] Pgr: [phone] or if no answer [phone] 12-19-15, 1:27 PM

## 2015-12-05 NOTE — Progress Notes (Signed)
   11/04/2015 1125  Clinical Encounter Type  Visited With Patient not available  Visit Type ED  Referral From Nurse  Spiritual Encounters  Spiritual Needs Emotional  Stress Factors  Patient Stress Factors Not reviewed  Paged by ED to contact Pt's daughter. Made contact and daughter on way from [place] Will follow up when family arrives.

## 2015-12-05 NOTE — Progress Notes (Signed)
eLink Physician-Brief Progress Note Patient Name: Kaylee Pittman DOB: [birth year] MRN: [id number]   Date of Service  11/15/2015  HPI/Events of Note  ABG on 100%/PRVC 20/TV 500/P 5 = 6.98/72.8/147.0/19. Lactic Acid = 9.4. Clinical picture c/w mixed metabolic and respiratory acidosis.   eICU Interventions  Will order: 1. Increase PRVC rate to 30. 2. NaHCO3 100 meq IV now.  3. NaHCO3 IV infusion at 50 mL/hour.  4. ABG at 6 PM.      Intervention Category Major Interventions: Acid-Base disturbance - evaluation and management;Respiratory failure - evaluation and management  Lenell AntuSommer,Steven Eugene 12/04/2015, 4:34 PM

## 2015-12-05 NOTE — Progress Notes (Signed)
eLink Physician-Brief Progress Note Patient Name: Kaylee Pittman DOB: Jun 23, 1950 MRN: [id number]   Date of Service  2015-08-28  HPI/Events of Note  Hypotension - BP = 68/45.  eICU Interventions  Will order: 1. NaHCO3 100 meq IV now. 2. Increase NaHCO3 IV infusion to 100 mL/hour.      Intervention Category Major Interventions: Hypotension - evaluation and management  Sommer,Steven Eugene 2015-08-28, 5:24 PM

## 2015-12-05 NOTE — Progress Notes (Signed)
   11/15/2015 1335  Clinical Encounter Type  Visited With Patient and family together  Visit Type ED  Referral From Nurse  Spiritual Encounters  Spiritual Needs Emotional  Stress Factors  Patient Stress Factors Not reviewed  Family Stress Factors Major life changes  Family was in room with Pt. Offered emotional support. No needs at this time.

## 2015-12-05 NOTE — ED Notes (Signed)
Pt. s husband reported to paramedics that pt. Was in bed yesterday because she was not feeling well.

## 2015-12-05 NOTE — ED Notes (Signed)
Family has arrived, Dr. Clarene DukeLittle has updated the family.  Critical Care has arrived.  Chaplain has arrived for support

## 2015-12-06 ENCOUNTER — Inpatient Hospital Stay (HOSPITAL_COMMUNITY): Payer: Medicare HMO

## 2015-12-06 DIAGNOSIS — Z66 Do not resuscitate: Secondary | ICD-10-CM

## 2015-12-06 DIAGNOSIS — A491 Streptococcal infection, unspecified site: Secondary | ICD-10-CM

## 2015-12-06 DIAGNOSIS — J9601 Acute respiratory failure with hypoxia: Secondary | ICD-10-CM

## 2015-12-06 DIAGNOSIS — J189 Pneumonia, unspecified organism: Secondary | ICD-10-CM

## 2015-12-06 DIAGNOSIS — Z515 Encounter for palliative care: Secondary | ICD-10-CM

## 2015-12-06 LAB — CBC
HCT: 51.9 % — ABNORMAL HIGH (ref 36.0–46.0)
Hemoglobin: 16 g/dL — ABNORMAL HIGH (ref 12.0–15.0)
MCH: 31.9 pg (ref 26.0–34.0)
MCHC: 30.8 g/dL (ref 30.0–36.0)
MCV: 103.4 fL — AB (ref 78.0–100.0)
PLATELETS: 172 10*3/uL (ref 150–400)
RBC: 5.02 MIL/uL (ref 3.87–5.11)
RDW: 13.7 % (ref 11.5–15.5)
WBC: 52 10*3/uL (ref 4.0–10.5)

## 2015-12-06 LAB — BASIC METABOLIC PANEL
Anion gap: 22 — ABNORMAL HIGH (ref 5–15)
BUN: 41 mg/dL — AB (ref 6–20)
CALCIUM: 7.1 mg/dL — AB (ref 8.9–10.3)
CO2: 24 mmol/L (ref 22–32)
CREATININE: 2.2 mg/dL — AB (ref 0.44–1.00)
Chloride: 97 mmol/L — ABNORMAL LOW (ref 101–111)
GFR calc Af Amer: 26 mL/min — ABNORMAL LOW (ref >60)
GFR, EST NON AFRICAN AMERICAN: 23 mL/min — AB (ref >60)
GLUCOSE: 246 mg/dL — AB (ref 65–99)
Potassium: 2.4 mmol/L — CL (ref 3.5–5.1)
SODIUM: 143 mmol/L (ref 135–145)

## 2015-12-06 LAB — BLOOD CULTURE ID PANEL (REFLEXED)
Acinetobacter baumannii: NOT DETECTED
CANDIDA ALBICANS: NOT DETECTED
CANDIDA KRUSEI: NOT DETECTED
CANDIDA PARAPSILOSIS: NOT DETECTED
CANDIDA TROPICALIS: NOT DETECTED
CARBAPENEM RESISTANCE: NOT DETECTED
Candida glabrata: NOT DETECTED
ENTEROBACTERIACEAE SPECIES: NOT DETECTED
ENTEROCOCCUS SPECIES: NOT DETECTED
Enterobacter cloacae complex: NOT DETECTED
Escherichia coli: NOT DETECTED
Haemophilus influenzae: NOT DETECTED
KLEBSIELLA OXYTOCA: NOT DETECTED
KLEBSIELLA PNEUMONIAE: NOT DETECTED
Listeria monocytogenes: NOT DETECTED
Methicillin resistance: NOT DETECTED
Neisseria meningitidis: NOT DETECTED
PROTEUS SPECIES: NOT DETECTED
PSEUDOMONAS AERUGINOSA: NOT DETECTED
STAPHYLOCOCCUS AUREUS BCID: NOT DETECTED
STAPHYLOCOCCUS SPECIES: NOT DETECTED
STREPTOCOCCUS AGALACTIAE: NOT DETECTED
STREPTOCOCCUS PNEUMONIAE: DETECTED — AB
Serratia marcescens: NOT DETECTED
Streptococcus pyogenes: NOT DETECTED
Streptococcus species: DETECTED — AB
VANCOMYCIN RESISTANCE: NOT DETECTED

## 2015-12-06 LAB — URINE CULTURE
Culture: NO GROWTH
Special Requests: NORMAL

## 2015-12-06 MED ORDER — MORPHINE SULFATE (PF) 2 MG/ML IV SOLN
2.0000 mg | INTRAVENOUS | Status: DC | PRN
  Administered 2015-12-06: 4 mg via INTRAVENOUS
  Filled 2015-12-06: qty 2

## 2015-12-06 MED ORDER — VASOPRESSIN 20 UNIT/ML IV SOLN
0.0300 [IU]/min | INTRAVENOUS | Status: DC
  Filled 2015-12-06: qty 2

## 2015-12-06 MED ORDER — DEXTROSE 5 % IV SOLN
2.0000 g | INTRAVENOUS | Status: DC
  Administered 2015-12-06: 2 g via INTRAVENOUS
  Filled 2015-12-06 (×2): qty 2

## 2015-12-06 MED ORDER — LORAZEPAM 2 MG/ML IJ SOLN
0.5000 mg | INTRAMUSCULAR | Status: DC | PRN
  Administered 2015-12-06: 1 mg via INTRAVENOUS
  Filled 2015-12-06: qty 1

## 2015-12-06 MED ORDER — POTASSIUM CHLORIDE 20 MEQ/15ML (10%) PO SOLN
40.0000 meq | ORAL | Status: AC
Start: 2015-12-06 — End: 2015-12-06
  Administered 2015-12-06 (×2): 40 meq
  Filled 2015-12-06 (×2): qty 30

## 2015-12-07 LAB — PATHOLOGIST SMEAR REVIEW

## 2015-12-08 ENCOUNTER — Telehealth: Payer: Self-pay

## 2015-12-08 LAB — CULTURE, BLOOD (ROUTINE X 2)

## 2015-12-08 NOTE — Telephone Encounter (Signed)
On 12/08/2015 I received a death certificate from [facility] [employer] (original). The death certificate is for cremation. The patient is a patient of Doctor Ramaswamy. The death certificate will be taken to Pulmonary Unit this pm for signature.  On 12/09/2015 I received the death certificate back from Doctor Ramaswamy. I got the death certificate ready and called the funeral home to let them know the death certificate is ready for pickup.

## 2015-12-16 NOTE — Progress Notes (Signed)
   11/24/2015 1400  Clinical Encounter Type  Visited With Patient and family together  Visit Type Critical Care  Referral From Nurse  Spiritual Encounters  Spiritual Needs Prayer;Emotional  Stress Factors  Patient Stress Factors None identified  Family Stress Factors Exhausted;Loss    Chaplain responded to page in 2h. Pt family has decided to place her on comfort care and will be removing life support this afternoon. Prayed with family and offered to come back this afternoon if not on another call.

## 2015-12-16 NOTE — Procedures (Signed)
Extubation Procedure Note  Patient Details:   Name: Kaylee Pittman DOB: [birth year] MRN: [id number]   Airway Documentation:     Evaluation  O2 sats: stable throughout and currently acceptable Complications: No apparent complications Patient did tolerate procedure well. Bilateral Breath Sounds: Rhonchi, Coarse crackles   No   Patient withdrawn from vent. Currently clinically acceptable. Patient family at bedside with patient.   Dannielle KarvonenMegan K Cederick Broadnax 11/20/2015, 2:03 PM

## 2015-12-16 NOTE — Progress Notes (Signed)
[facility] Physician-Brief Progress Note Patient Name: Kaylee Pittman DOB: 1951-02-07 MRN: [id number]   Date of Service  11/30/2015  HPI/Events of Note  hypokalemia  eICU Interventions  Potassium replaced     Intervention Category Intermediate Interventions: Electrolyte abnormality - evaluation and management  Marthena Whitmyer 12/14/2015, 3:50 AM

## 2015-12-16 NOTE — Progress Notes (Signed)
Patient asystole. Family at bedside. Two RN's myself and Tammy Soursngela Barlow auscultated for heart sounds. Time of death [id number].

## 2015-12-16 NOTE — Progress Notes (Signed)
PULMONARY / CRITICAL CARE MEDICINE   Name: Kaylee Pittman MRN: [id number] DOB: [birth year]    ADMISSION DATE:  2015/12/16 CONSULTATION DATE:  12-16-2015  REFERRING MD:  Dr. Clarene Duke   CHIEF COMPLAINT:  Cardiac Arrest  HISTORY OF PRESENT ILLNESS:  65 y/o F, smoker, with PMH of HTN, HLD, CHF, OSA, and COPD who presented to [facility] on 10/21 as a cardiac arrest.    Husband reports he was in the next room and he heard a "thud".  The patients husband was unable to start CPR (he is very frail).  He called 911 at 0900.  Fire was on scene at 9:10 am.  She was found to be in asystole.  EMS arrived at 9:15 and CPR was in progress / ongoing.  She was treated with 2 epi.  At 9:30 they achieved ROSC.  The patient was ST on the monitor.  She unfortunately lost pulses again at 9:35 am.  ROSC achieved 9:45 am. She was transported to the ER for further evaluation.  On arrival to the ER, she was started on an EPI gtt and volume resuscitated.  Total down time estimated at 45 minutes.    Initial ER labs - ph 7.04 / 59 / 143 / 16, Na 143, K 4.2, CO2 18, BUN 33, Sr Cr 1.64 (last checked in system in 2013 was 0.71), glucose 128, albumin 1.9, AST 712, ALT 369, t.protein 5.7, BNP 357, troponin 0.36, lactic acid 14.88, WBC 48.9, Hgb 14.7, MVC 103, and platelets.  CXR showed dense consolidation in the LUL, cardiomegaly.  The patient was also started on Levophed.  Unfortunately, they've had difficulty maintaining adequate blood pressure.   Family reports that the patient has been complaining of feeling poorly for approximately 2 weeks. She recently was seen by her primary provider but they are unaware of details surrounding that visit. Daughter reports she had noted her mother's cough to be increased in frequency and more wet/ productive.  Husband reports the patient stayed in bed all day on 10/20. He states that when he got to her this morning she was cold.  PCCM called for ICU admission.      SUBJECTIVE: RN reports  patient's BP dropped overnight, bicarb gtt started.  Remains on high dose pressors, refractory shock. Bicarb gtt infusing.   VITAL SIGNS: BP (!) 84/34   Pulse (!) 107   Temp 97.3 F (36.3 C) (Oral)   Resp 20   Ht 4\' 7"  (1.397 m)   Wt 215 lb 2.7 oz (97.6 kg)   LMP 08/15/2000   SpO2 92%   BMI 50.01 kg/m   HEMODYNAMICS:    VENTILATOR SETTINGS: Vent Mode: PRVC FiO2 (%):  [100 %] 100 % Set Rate:  [20 bmp] 20 bmp Vt Set:  [500 mL] 500 mL PEEP:  [5 cmH20] 5 cmH20 Plateau Pressure:  [27 cmH20-33 cmH20] 33 cmH20  INTAKE / OUTPUT: I/O last 3 completed shifts: In: 7071.2 [I.V.:6721.2; IV Piggyback:350] Out: 250 [Urine:250]  PHYSICAL EXAMINATION: General:  Chronically ill appearing female on mechanical ventilation Neuro:  Obtunded, pupils 3 MM non-reactive, no effort on vent (PSV), no gag with suction, no corneal response HEENT:  ET tube in place, pink frothy secretions from tube Cardiovascular:  s1s2 distant, no m/r/g  Lungs:  Even/non-labored on vent, no spontaneous effort, lungs w coarse rhonchi bilaterally.  Marks on chest from [place] device Abdomen:  Obese/firm, bsx4 decreased  Musculoskeletal:  No acute deformities Skin:  Cool, dry, LE mottled   LABS:  BMET  Recent Labs Lab 11/16/2015 1035 11/16/2015 1045 2015-12-26 0211  NA 143 143 143  K 4.2 3.7 2.4*  CL 104 106 97*  CO2 18*  --  24  BUN 33* 44* 41*  CREATININE 1.64* 1.20* 2.20*  GLUCOSE 128* 125* 246*    Electrolytes  Recent Labs Lab 11/23/2015 1035 12/26/2015 0211  CALCIUM 7.9* 7.1*    CBC  Recent Labs Lab 11/28/2015 1035 11/20/2015 1045 12/26/15 0211  WBC 48.9*  --  52.0*  HGB 14.7 16.0* 16.0*  HCT 48.4* 47.0* 51.9*  PLT 309  --  172    Coag's No results for input(s): APTT, INR in the last 168 hours.  Sepsis Markers  Recent Labs Lab 12/04/2015 1044 12/01/2015 1444  LATICACIDVEN 14.88* 9.4*    ABG  Recent Labs Lab 11/16/2015 1052 12/01/2015 1610  PHART 7.043* 6.981*  PCO2ART 59.3* 72.8*   PO2ART 143.0* 147.0*    Liver Enzymes  Recent Labs Lab 12/08/2015 1035  AST 712*  ALT 369*  ALKPHOS 167*  BILITOT 2.1*  ALBUMIN 1.9*    Cardiac Enzymes  Recent Labs Lab 11/23/2015 1618 11/20/2015 2046  TROPONINI 0.23* 0.22*    Glucose No results for input(s): GLUCAP in the last 168 hours.  Imaging Dg Chest Port 1 View  Result Date: December 26, 2015 CLINICAL DATA:  65 year old female with pneumonia. Subsequent encounter. EXAM: PORTABLE CHEST 1 VIEW COMPARISON:  11/20/2015 chest x-ray. FINDINGS: Progressive consolidation of the left lung, now with entire left lung consolidation. Air bronchograms suggesting this represents a pneumonia. No significant mediastinal shift to suggest the presence of underlying atelectasis. Mucous plug involving left lower lobe bronchi contributing to interval change not entirely excluded. Recommend follow-up and clearance to exclude underlying mass. Right lung with mild pulmonary vascular prominence. Endotracheal tube tip 5 cm above the carina. Nasogastric tube courses below the diaphragm. Tip is not included on the present exam. No pneumothorax. Mild acromioclavicular joint degenerative changes. IMPRESSION: Progressive consolidation left lung now with complete consolidation left lung. Please see above. Electronically Signed   By: Lacy Duverney M.D.   On: 2015/12/26 07:26   Dg Chest Port 1 View  Result Date: 11/18/2015 CLINICAL DATA:  Post cardiac arrest, CPR, history of COPD, CHF and hypertension. EXAM: PORTABLE CHEST 1 VIEW COMPARISON:  12/31/2013 FINDINGS: Monitor leads overlie the chest. Endotracheal tube is 6.2 cm above the carina. Dense consolidation/ collapse of the left upper lobe. No large effusion or pneumothorax. Right lung remains clear. Degenerative changes of the spine. Heart is enlarged. IMPRESSION: Endotracheal tube is 6.2 cm above the carina. Left upper lobe dense consolidation/collapse, compatible with consolidative pneumonia. Difficult to exclude  underlying central mass. Cardiomegaly Electronically Signed   By: Judie Petit.  Shick M.D.   On: 11/28/2015 10:49   Dg Abd Portable 1v  Result Date: 11/20/2015 CLINICAL DATA:  Enteric tube placement EXAM: PORTABLE ABDOMEN - 1 VIEW COMPARISON:  Chest radiograph from earlier today FINDINGS: Enteric tube terminates in the body of the stomach. No dilated small bowel loops. No evidence of pneumatosis or pneumoperitoneum. No pathologic soft tissue calcifications. Re- demonstrated is severe left upper lobe consolidation with air bronchograms. Endotracheal tube tip is 4.3 cm above the carina. IMPRESSION: Enteric tube terminates in the body of the stomach. Nonobstructive bowel gas pattern. Severe left upper lobe consolidation with air bronchograms, suggesting pneumonia. Recommend chest imaging follow-up to resolution. Electronically Signed   By: Delbert Phenix M.D.   On: 11/30/2015 17:06     STUDIES:  CT Head 10/21 >>  CULTURES: BCx2 10/21 >> GPC's pairs >>  Sputum 10/21 >>  UA 10/21 >> nitrite post, 0-5 wbc, rare bacteria  ANTIBIOTICS: Vanco 10/21 >>  Cefepime 10/21 >>   SIGNIFICANT EVENTS: 10/21  Admit after 45 minute CPR / cardiac arrest  LINES/TUBES: ETT 10/21 >>   DISCUSSION: 65 y/o F admitted 10/21 with LUL dense infiltrate, post prolonged cardiac arrest (>45 minutes) with at least 10 minutes of no CPR (if not more).    ASSESSMENT / PLAN:  PULMONARY A: Dense LUL Infiltrate  R/O Underlying Mass Acute Hypoxic Respiratory Failure  Pulmonary Edema - post arrest P:   PRVC 8 cc/kg  Wean PEEP / FiO2 for sats > 92%  Intermittent CXR  Scheduled duonebs Q6 + PRN albuterol   CARDIOVASCULAR A:  Cardiac Arrest - initial rhythm asystole  Suspected Sepsis - LUL infiltrate  P:  Epi + Levophed gtt's for MAP >65 Very poor prognosis, family aware Not a cooling candidate due to prolonged cardiac arrest No further titration of gtt's  DNR in the event of further arrest  Assess ECHO  Trend troponin    RENAL A:   AKI - in setting of arrest  Lactic Acidosis  P:   Trend BMP / UOP  Replace electrolytes as indicated   GASTROINTESTINAL A:   Obesity  P:   NPO  Pepcid for SUP   HEMATOLOGIC A:   Leukocytosis - wbc 48k on admit  P:  Trend CBC  SCD's for DVT prophylaxis   INFECTIOUS A:   Suspected Sepsis - with arrest / shock  P:   Cultures as above  Abx as above   ENDOCRINE A:   DM    P:   SSI   NEUROLOGIC A:   Acute Metabolic Encephalopathy - clinical exam 10/22 worrisome for brain death P:   RASS goal: 0 CT head once stabilized  Frequent neuro exams  PRN morphine for pain / evidence of discomfort   FAMILY  - Updates: Family updated at bedside.  Husband and daughter indicate the patient would not want to be on life support if no chance of meaningful recovery. Given duration of cardiac arrest, it is very concerning that she has suffered a catastrophic event. Family aware of the gravity of circumstances. They are in agreement for no further CPR. Discussed clinical exam 10/22 with family.  They are going to talk with grandchildren and husband (who has dementia) regarding withdrawal of care.     - Inter-disciplinary family meet or Palliative Care meeting due by:  10/28    Canary BrimBrandi Jurrell Royster, NP-C [place] [facility] Pgr: [phone] or if no answer [phone] 12/07/2015, 7:48 AM

## 2015-12-16 NOTE — Progress Notes (Signed)
Dr Deterding([facility]) jprovided with an update regarding patient's medical status: decreasing BP, oxygen saturations 80's; patient having 10ml UOP over past 3 hours; no new orders obtained.

## 2015-12-16 NOTE — Progress Notes (Signed)
 1mg  of Ativan wasted with Melonie Floridaraci Eileene Kisling as second RN to verify.

## 2015-12-16 NOTE — Progress Notes (Signed)
PHARMACY - PHYSICIAN COMMUNICATION CRITICAL VALUE ALERT - BLOOD CULTURE IDENTIFICATION (BCID)  Results for orders placed or performed during the hospital encounter of 11/25/2015  Blood Culture ID Panel (Reflexed) (Collected: 12/04/2015 10:45 AM)  Result Value Ref Range   Enterococcus species NOT DETECTED NOT DETECTED   Vancomycin resistance NOT DETECTED NOT DETECTED   Listeria monocytogenes NOT DETECTED NOT DETECTED   Staphylococcus species NOT DETECTED NOT DETECTED   Staphylococcus aureus NOT DETECTED NOT DETECTED   Methicillin resistance NOT DETECTED NOT DETECTED   Streptococcus species DETECTED (A) NOT DETECTED   Streptococcus agalactiae NOT DETECTED NOT DETECTED   Streptococcus pneumoniae DETECTED (A) NOT DETECTED   Streptococcus pyogenes NOT DETECTED NOT DETECTED   Acinetobacter baumannii NOT DETECTED NOT DETECTED   Enterobacteriaceae species NOT DETECTED NOT DETECTED   Enterobacter cloacae complex NOT DETECTED NOT DETECTED   Escherichia coli NOT DETECTED NOT DETECTED   Klebsiella oxytoca NOT DETECTED NOT DETECTED   Klebsiella pneumoniae NOT DETECTED NOT DETECTED   Proteus species NOT DETECTED NOT DETECTED   Serratia marcescens NOT DETECTED NOT DETECTED   Carbapenem resistance NOT DETECTED NOT DETECTED   Haemophilus influenzae NOT DETECTED NOT DETECTED   Neisseria meningitidis NOT DETECTED NOT DETECTED   Pseudomonas aeruginosa NOT DETECTED NOT DETECTED   Candida albicans NOT DETECTED NOT DETECTED   Candida glabrata NOT DETECTED NOT DETECTED   Candida krusei NOT DETECTED NOT DETECTED   Candida parapsilosis NOT DETECTED NOT DETECTED   Candida tropicalis NOT DETECTED NOT DETECTED    Name of physician (or Provider) Contacted: Dr .Darrick Pennaeterding  Changes to prescribed antibiotics required: D/c Vanc and Cefepime. Start Rocephin 2gm IV q24h  Christoper Fabianaron [place] Lasseter, PharmD, BCPS Clinical pharmacist, pager [phone] 08-Oct-2015  4:07 AM

## 2015-12-16 NOTE — Progress Notes (Signed)
Pt transitioned to comfort care. Family at bedside and ready for withdrawal of care. Pt extubated. Will continue to monitor.

## 2015-12-16 NOTE — Progress Notes (Signed)
Telephoned and spoke with patient's husband and informed him of patient's declining condition. Patient's husband verbalized that he would like for me to keep him updated on any further changes in medical condition. Attempted to call patient's daughter without success; unable to leave voicemail due to mailbox not being set up).

## 2015-12-16 NOTE — Progress Notes (Signed)
Followed up with family this afternoon. They are gathered at bedside.  Re-examined, updated on exam (unchanged) and discussed transition toward comfort care.    Plan: Comfort focused care [facility] notified PRN morphine for pain / SOB Ativan for sedation   Canary BrimBrandi Jacion Dismore, NP-C [place] [facility] Pgr: [phone] or if no answer [phone] 11/29/2015, 1:25 PM

## 2015-12-16 NOTE — Progress Notes (Signed)
Provided patient's daughter(Shannon) with an update.

## 2015-12-16 DEATH — deceased

## 2016-01-15 NOTE — Discharge Summary (Signed)
           DISCHARGE SUMMARY    Date of admit: 11-27-2015 10:23 AM Date of discharge: 11/19/2015  2:13 PM Length of Stay: 1 days  PCP is Dorrene GermanEdwin A Avbuere, MD   PROBLEM LIST Active Problems: Community acquired pneumonia   Streptococcus pneumoniae Pneumonia with Bacteremia  Acute respiratory failure (HCC) Acute Kidney Injury Lactic acidosis     Cardiopulmonary arrest (HCC)     Coma (HCC)     Terminal care DNAR (do not attempt resuscitation)         SUMMARY Kaylee Pittman was 65 y.o. patient with    has a past medical history of CHF (congestive heart failure) (HCC); COPD (chronic obstructive pulmonary disease) (HCC); Emphysema; Hypercholesteremia; Hypertension; Pneumonia; and Sleep apnea.   has a past surgical history that includes Back surgery; Cesarean section; Tubal ligation; and Appendectomy.   Admitted on 11-27-2015  After husband heard a thud and she was found to be in asystolic arrest. Prior to that she had been feeling poorly for 2 weeks. Post arrest was in coma and in refractory shock and renal failure and lactic acidosis . This was associated with lack of bystander CPR and prolonged CPR. CXR - showed large Left Upper Lobe Consolidation. Blood culture shows streptococcus bacteremia as primary etiology. Coma and refractory shock persisted and family discussion held and comfort care approach adopted and patient expired              SIGNED Dr. Kalman ShanMurali Micahel Omlor, M.D., [facility] ClinicF.C.C.P Pulmonary and Critical Care Medicine Staff Physician [facility]  Pulmonary and Critical Care Pager: [phone], If no answer or between  15:00h - 7:00h: call [phone]  12/22/2015 8:14 AM
